# Patient Record
Sex: Male | Born: 1965
Health system: Southern US, Community
[De-identification: ages and names within clinical notes are randomized; demographics above are authoritative.]

## PROBLEM LIST (undated history)

## (undated) DIAGNOSIS — M722 Plantar fascial fibromatosis: Secondary | ICD-10-CM

## (undated) DIAGNOSIS — J439 Emphysema, unspecified: Secondary | ICD-10-CM

## (undated) DIAGNOSIS — G4730 Sleep apnea, unspecified: Secondary | ICD-10-CM

## (undated) DIAGNOSIS — J449 Chronic obstructive pulmonary disease, unspecified: Secondary | ICD-10-CM

## (undated) DIAGNOSIS — I10 Essential (primary) hypertension: Secondary | ICD-10-CM

## (undated) DIAGNOSIS — M549 Dorsalgia, unspecified: Secondary | ICD-10-CM

## (undated) DIAGNOSIS — T7840XA Allergy, unspecified, initial encounter: Secondary | ICD-10-CM

## (undated) DIAGNOSIS — M25569 Pain in unspecified knee: Secondary | ICD-10-CM

## (undated) DIAGNOSIS — I00 Rheumatic fever without heart involvement: Secondary | ICD-10-CM

## (undated) DIAGNOSIS — E7800 Pure hypercholesterolemia, unspecified: Secondary | ICD-10-CM

## (undated) DIAGNOSIS — M25519 Pain in unspecified shoulder: Secondary | ICD-10-CM

## (undated) DIAGNOSIS — K5900 Constipation, unspecified: Secondary | ICD-10-CM

## (undated) DIAGNOSIS — F419 Anxiety disorder, unspecified: Secondary | ICD-10-CM

## (undated) DIAGNOSIS — M2550 Pain in unspecified joint: Secondary | ICD-10-CM

## (undated) DIAGNOSIS — M1990 Unspecified osteoarthritis, unspecified site: Secondary | ICD-10-CM

## (undated) DIAGNOSIS — R600 Localized edema: Secondary | ICD-10-CM

## (undated) DIAGNOSIS — R0602 Shortness of breath: Secondary | ICD-10-CM

## (undated) DIAGNOSIS — K219 Gastro-esophageal reflux disease without esophagitis: Secondary | ICD-10-CM

## (undated) DIAGNOSIS — F32A Depression, unspecified: Secondary | ICD-10-CM

## (undated) HISTORY — PX: ANTERIOR CRUCIATE LIGAMENT REPAIR: SHX115

## (undated) HISTORY — DX: Chronic obstructive pulmonary disease, unspecified: J44.9

## (undated) HISTORY — DX: Constipation, unspecified: K59.00

## (undated) HISTORY — DX: Pure hypercholesterolemia, unspecified: E78.00

## (undated) HISTORY — DX: Pain in unspecified joint: M25.50

## (undated) HISTORY — DX: Pain in unspecified knee: M25.569

## (undated) HISTORY — DX: Anxiety disorder, unspecified: F41.9

## (undated) HISTORY — DX: Unspecified osteoarthritis, unspecified site: M19.90

## (undated) HISTORY — DX: Pain in unspecified shoulder: M25.519

## (undated) HISTORY — DX: Localized edema: R60.0

## (undated) HISTORY — DX: Depression, unspecified: F32.A

## (undated) HISTORY — DX: Rheumatic fever without heart involvement: I00

## (undated) HISTORY — PX: BUNIONECTOMY: SHX129

## (undated) HISTORY — DX: Gastro-esophageal reflux disease without esophagitis: K21.9

## (undated) HISTORY — PX: HUMERUS FRACTURE SURGERY: SHX670

## (undated) HISTORY — DX: Emphysema, unspecified: J43.9

## (undated) HISTORY — DX: Sleep apnea, unspecified: G47.30

## (undated) HISTORY — PX: ROTATOR CUFF REPAIR: SHX139

## (undated) HISTORY — DX: Shortness of breath: R06.02

## (undated) HISTORY — DX: Plantar fascial fibromatosis: M72.2

## (undated) HISTORY — PX: FRACTURE SURGERY: SHX138

## (undated) HISTORY — DX: Allergy, unspecified, initial encounter: T78.40XA

## (undated) HISTORY — DX: Dorsalgia, unspecified: M54.9

---

## 1991-11-02 HISTORY — PX: KNEE ARTHROSCOPY: SHX127

## 2003-06-01 ENCOUNTER — Ambulatory Visit (HOSPITAL_BASED_OUTPATIENT_CLINIC_OR_DEPARTMENT_OTHER): Admission: RE | Admit: 2003-06-01 | Discharge: 2003-06-01 | Payer: Self-pay | Admitting: Family Medicine

## 2004-12-16 ENCOUNTER — Ambulatory Visit (HOSPITAL_COMMUNITY): Admission: RE | Admit: 2004-12-16 | Discharge: 2004-12-16 | Payer: Self-pay | Admitting: Orthopedic Surgery

## 2005-02-02 ENCOUNTER — Ambulatory Visit (HOSPITAL_BASED_OUTPATIENT_CLINIC_OR_DEPARTMENT_OTHER): Admission: RE | Admit: 2005-02-02 | Discharge: 2005-02-02 | Payer: Self-pay | Admitting: Orthopedic Surgery

## 2005-10-15 ENCOUNTER — Ambulatory Visit (HOSPITAL_COMMUNITY): Admission: RE | Admit: 2005-10-15 | Discharge: 2005-10-15 | Payer: Self-pay | Admitting: Orthopedic Surgery

## 2005-10-22 ENCOUNTER — Emergency Department (HOSPITAL_COMMUNITY): Admission: EM | Admit: 2005-10-22 | Discharge: 2005-10-22 | Payer: Self-pay | Admitting: Emergency Medicine

## 2011-05-28 ENCOUNTER — Emergency Department (HOSPITAL_COMMUNITY): Payer: 59

## 2011-05-28 ENCOUNTER — Emergency Department (HOSPITAL_COMMUNITY)
Admission: EM | Admit: 2011-05-28 | Discharge: 2011-05-29 | Disposition: A | Discharge status: Home / Self Care | Payer: 59 | Attending: Emergency Medicine | Admitting: Emergency Medicine

## 2011-05-28 DIAGNOSIS — Z79899 Other long term (current) drug therapy: Secondary | ICD-10-CM | POA: Insufficient documentation

## 2011-05-28 DIAGNOSIS — R209 Unspecified disturbances of skin sensation: Secondary | ICD-10-CM | POA: Insufficient documentation

## 2011-05-28 DIAGNOSIS — G510 Bell's palsy: Secondary | ICD-10-CM | POA: Insufficient documentation

## 2011-05-28 DIAGNOSIS — R42 Dizziness and giddiness: Secondary | ICD-10-CM | POA: Insufficient documentation

## 2011-05-28 DIAGNOSIS — R51 Headache: Secondary | ICD-10-CM | POA: Insufficient documentation

## 2011-05-28 DIAGNOSIS — R1310 Dysphagia, unspecified: Secondary | ICD-10-CM | POA: Insufficient documentation

## 2011-05-28 DIAGNOSIS — R634 Abnormal weight loss: Secondary | ICD-10-CM | POA: Insufficient documentation

## 2011-05-28 DIAGNOSIS — R112 Nausea with vomiting, unspecified: Secondary | ICD-10-CM | POA: Insufficient documentation

## 2011-05-28 DIAGNOSIS — R29810 Facial weakness: Secondary | ICD-10-CM | POA: Insufficient documentation

## 2011-05-28 DIAGNOSIS — I10 Essential (primary) hypertension: Secondary | ICD-10-CM | POA: Insufficient documentation

## 2011-05-28 DIAGNOSIS — R630 Anorexia: Secondary | ICD-10-CM | POA: Insufficient documentation

## 2011-05-28 LAB — POCT I-STAT, CHEM 8
BUN: 31 mg/dL — ABNORMAL HIGH (ref 6–23)
Calcium, Ion: 1.08 mmol/L — ABNORMAL LOW (ref 1.12–1.32)
Chloride: 107 mEq/L (ref 96–112)
Creatinine, Ser: 1.4 mg/dL — ABNORMAL HIGH (ref 0.50–1.35)
Glucose, Bld: 115 mg/dL — ABNORMAL HIGH (ref 70–99)
Hemoglobin: 12.9 g/dL — ABNORMAL LOW (ref 13.0–17.0)
Potassium: 3.8 mEq/L (ref 3.5–5.1)
Sodium: 140 mEq/L (ref 135–145)
TCO2: 22 mmol/L (ref 0–100)

## 2011-05-28 LAB — DIFFERENTIAL
Basophils Absolute: 0.1 10*3/uL (ref 0.0–0.1)
Basophils Relative: 1 % (ref 0–1)
Eosinophils Absolute: 0 10*3/uL (ref 0.0–0.7)
Eosinophils Relative: 0 % (ref 0–5)
Lymphs Abs: 2.5 10*3/uL (ref 0.7–4.0)
Monocytes Absolute: 0.9 10*3/uL (ref 0.1–1.0)
Monocytes Relative: 9 % (ref 3–12)
Neutro Abs: 6.9 10*3/uL (ref 1.7–7.7)

## 2011-05-28 LAB — CBC
HCT: 36.7 % — ABNORMAL LOW (ref 39.0–52.0)
Hemoglobin: 12.7 g/dL — ABNORMAL LOW (ref 13.0–17.0)
MCH: 30 pg (ref 26.0–34.0)
MCHC: 34.6 g/dL (ref 30.0–36.0)
MCV: 86.8 fL (ref 78.0–100.0)
Platelets: 215 10*3/uL (ref 150–400)
RDW: 13.2 % (ref 11.5–15.5)

## 2012-10-14 ENCOUNTER — Encounter (HOSPITAL_COMMUNITY): Payer: Self-pay | Admitting: *Deleted

## 2012-10-14 ENCOUNTER — Emergency Department (INDEPENDENT_AMBULATORY_CARE_PROVIDER_SITE_OTHER)
Admission: EM | Admit: 2012-10-14 | Discharge: 2012-10-14 | Disposition: A | Discharge status: Home / Self Care | Payer: Worker's Compensation | Source: Home / Self Care | Attending: Family Medicine | Admitting: Family Medicine

## 2012-10-14 ENCOUNTER — Emergency Department (INDEPENDENT_AMBULATORY_CARE_PROVIDER_SITE_OTHER): Payer: Worker's Compensation

## 2012-10-14 DIAGNOSIS — S6710XA Crushing injury of unspecified finger(s), initial encounter: Secondary | ICD-10-CM

## 2012-10-14 HISTORY — DX: Essential (primary) hypertension: I10

## 2012-10-14 MED ORDER — OXYCODONE-ACETAMINOPHEN 10-325 MG PO TABS: 1.0000 | ORAL_TABLET | Freq: Four times a day (QID) | ORAL | Status: DC | PRN

## 2012-10-14 NOTE — ED Notes (Signed)
Pt reports smashing finger in metal pin while at work - right 3rd finger

## 2012-10-14 NOTE — ED Provider Notes (Signed)
History     CSN: [id number]  Arrival date & time 10/14/12  1701   First MD Initiated Contact with Patient 10/14/12 1712      Chief Complaint  Patient presents with  . Finger Injury    (Consider location/radiation/quality/duration/timing/severity/associated sxs/prior treatment) Patient is a 46 y.o. male presenting with hand pain. The history is provided by the patient.  Hand Pain This is a new problem. The current episode started less than 1 hour ago (crush injury from heavy metal hinge at work during [employer] .). The problem has not changed since onset.Associated symptoms comments: Tetanus utd..    Past Medical History  Diagnosis Date  . Hypertension     Past Surgical History  Procedure Date  . Anterior cruciate ligament repair   . Rotator cuff repair     Family History  Problem Relation Age of Onset  . Family history unknown: Yes    History  Substance Use Topics  . Smoking status: Never Smoker   . Smokeless tobacco: Not on file  . Alcohol Use: No      Review of Systems  Constitutional: Negative.   Musculoskeletal: Positive for joint swelling.  Skin: Positive for wound.    Allergies  Review of patient's allergies indicates no known allergies.  Home Medications   Current Outpatient Rx  Name  Route  Sig  Dispense  Refill  . VITAMIN C 100 MG PO TABS   Oral   Take 100 mg by mouth daily.         . ASPIRIN BUFFERED 325 MG PO TABS   Oral   Take 325 mg by mouth daily.         . OXYCODONE-ACETAMINOPHEN 10-325 MG PO TABS   Oral   Take 1 tablet by mouth every 6 (six) hours as needed for pain.   30 tablet   0     BP 136/74  Pulse 82  Temp 98.2 F (36.8 C) (Oral)  Resp 18  SpO2 98%  Physical Exam  Nursing note and vitals reviewed. Constitutional: He is oriented to person, place, and time. He appears well-developed and well-nourished. No distress.  Musculoskeletal: He exhibits tenderness.       Hands: Neurological: He is alert and  oriented to person, place, and time.  Skin: Skin is warm and dry.    ED Course  Procedures (including critical care time)  Labs Reviewed - No data to display No results found.   1. Crush injury to finger       MDM  X-rays reviewed and report per radiologist.         Linna Hoff, MD 10/16/12 [phone]

## 2012-10-30 ENCOUNTER — Ambulatory Visit: Payer: Self-pay

## 2012-10-30 ENCOUNTER — Other Ambulatory Visit: Payer: Self-pay | Admitting: Occupational Medicine

## 2012-10-30 DIAGNOSIS — M79646 Pain in unspecified finger(s): Secondary | ICD-10-CM

## 2013-10-19 ENCOUNTER — Ambulatory Visit: Payer: Self-pay | Admitting: Family Medicine

## 2013-10-19 VITALS — BP 146/82 | HR 62 | Temp 98.3°F | Resp 16 | Ht 67.0 in | Wt 184.2 lb

## 2013-10-19 DIAGNOSIS — Z0289 Encounter for other administrative examinations: Secondary | ICD-10-CM

## 2013-10-19 NOTE — Progress Notes (Signed)
U/A SpGr-1.005 Protein- Neg Blood- Neg Glucose- Neg 

## 2013-10-19 NOTE — Progress Notes (Addendum)
This chart was scribed for [name], Chad Brooks by Joaquin Music, ED Scribe. This patient was seen in room Room/bed 1 and the patient's care was started at 8:48 AM. Subjective:    Patient ID: Chad Brooks, male    DOB: December 22, 1965, 47 y.o.   MRN: [id number] Chief Complaint  Patient presents with  . Employment Physical    DOT   HPI Chad Brooks is a 47 y.o. male with a hx of HTN who presents to the [facility] for DOT/Employment Physical. Pt reports being employed for the [employer]. Pt states he takes an Asprin daily for his health. Pt states he has lost weight recently by monitoring his diet. He reports taking Diovan intermittently and states he has been off this medication for 3 weeks, diet controlled only.  Pt reports feeling healthy and states he has been walking daily. He states he has stopped smoking and states this year marks 4 years of not smoking.   Chad Brooks,Chad Brooks, Chad Brooks  There are no active problems to display for this patient.  Past Medical History  Diagnosis Date  . Hypertension   . Allergy    Past Surgical History  Procedure Laterality Date  . Anterior cruciate ligament repair    . Rotator cuff repair     No Known Allergies  Current outpatient prescriptions:Ascorbic Acid (VITAMIN C) 100 MG tablet, Take 100 mg by mouth daily., Disp: , Rfl: ;  aspirin 325 MG buffered tablet, Take 325 mg by mouth daily., Disp: , Rfl: ;  oxyCODONE-acetaminophen (PERCOCET) 10-325 MG per tablet, Take 1 tablet by mouth every 6 (six) hours as needed for pain., Disp: 30 tablet, Rfl: 0  History   Social History  . Marital Status: Single    Spouse Name: N/A    Number of Children: N/A  . Years of Education: N/A   Social History Main Topics  . Smoking status: Never Smoker   . Smokeless tobacco: None  . Alcohol Use: No  . Drug Use: No  . Sexual Activity: No   Other Topics Concern  . None   Social History Narrative  . None   Review of Systems Per DOT form -  reviewed, no positive responses.     Objective:   Physical Exam  Vitals reviewed. Constitutional: He is oriented to person, place, and time. He appears well-developed and well-nourished.  HENT:  Head: Normocephalic and atraumatic.  Right Ear: External ear normal.  Left Ear: External ear normal.  Mouth/Throat: Oropharynx is clear and moist.  Eyes: Conjunctivae and EOM are normal. Pupils are equal, round, and reactive to light.  Neck: Normal range of motion. Neck supple. No JVD present. Carotid bruit is not present. No thyromegaly present.  Cardiovascular: Normal rate, regular rhythm, normal heart sounds and intact distal pulses.   No murmur heard. Pulmonary/Chest: Effort normal and breath sounds normal. No respiratory distress. He has no wheezes. He has no rales.  Abdominal: Soft. He exhibits no distension. There is no tenderness. Hernia confirmed negative in the right inguinal area and confirmed negative in the left inguinal area.  Musculoskeletal: Normal range of motion. He exhibits no edema and no tenderness.  Lymphadenopathy:    He has no cervical adenopathy.  Neurological: He is alert and oriented to person, place, and time. He has normal reflexes.  Skin: Skin is warm and dry.  Psychiatric: He has a normal mood and affect. His behavior is normal.   Filed Vitals:   10/19/13 0819  BP: 140/80  Pulse: 62  Temp: 98.3 F (36.8 C)  TempSrc: Oral  Resp: 16  Height: 5\' 7"  (1.702 m)  Weight: 184 lb 3.2 oz (83.553 kg)  SpO2: 99%   Assessment & Plan:  Chad Brooks is a 47 y.o. male Health examination of defined subpopulation  DOT physical - 1 year card for hx htn, but diet controlled at present. Last took Diovan few weeks ago.  Cont home monitoring and follow up with PCP. See ppwk.   No orders of the defined types were placed in this encounter.   Patient Instructions  1 year card for history of high blood pressure, although diet controlled only for now. Bring your home blood  pressure meter to compare to readings at doctor's office.

## 2013-10-19 NOTE — Patient Instructions (Signed)
1 year card for history of high blood pressure, although diet controlled only for now. Bring your home blood pressure meter to compare to readings at doctor's office.

## 2016-03-10 ENCOUNTER — Other Ambulatory Visit: Payer: Self-pay | Admitting: Surgery

## 2016-03-10 DIAGNOSIS — R1032 Left lower quadrant pain: Secondary | ICD-10-CM

## 2016-03-15 LAB — HM COLONOSCOPY

## 2016-03-17 ENCOUNTER — Ambulatory Visit
Admission: RE | Admit: 2016-03-17 | Discharge: 2016-03-17 | Disposition: A | Discharge status: Home / Self Care | Payer: 59 | Source: Ambulatory Visit | Attending: Surgery | Admitting: Surgery

## 2016-03-17 DIAGNOSIS — R1032 Left lower quadrant pain: Secondary | ICD-10-CM

## 2016-03-17 MED ORDER — IOPAMIDOL (ISOVUE-300) INJECTION 61%
100.0000 mL | Freq: Once | INTRAVENOUS | Status: AC | PRN
  Administered 2016-03-17: 100 mL via INTRAVENOUS

## 2016-09-06 ENCOUNTER — Other Ambulatory Visit: Payer: Self-pay | Admitting: Sports Medicine

## 2016-09-06 DIAGNOSIS — M25512 Pain in left shoulder: Secondary | ICD-10-CM

## 2016-09-08 ENCOUNTER — Ambulatory Visit
Admission: RE | Admit: 2016-09-08 | Discharge: 2016-09-08 | Disposition: A | Discharge status: Home / Self Care | Payer: 59 | Source: Ambulatory Visit | Attending: Sports Medicine | Admitting: Sports Medicine

## 2016-09-08 ENCOUNTER — Other Ambulatory Visit: Payer: Self-pay

## 2016-09-08 DIAGNOSIS — M25512 Pain in left shoulder: Secondary | ICD-10-CM

## 2016-11-02 DIAGNOSIS — M25612 Stiffness of left shoulder, not elsewhere classified: Secondary | ICD-10-CM | POA: Diagnosis not present

## 2016-11-02 DIAGNOSIS — S42295D Other nondisplaced fracture of upper end of left humerus, subsequent encounter for fracture with routine healing: Secondary | ICD-10-CM | POA: Diagnosis not present

## 2016-11-02 DIAGNOSIS — M25512 Pain in left shoulder: Secondary | ICD-10-CM | POA: Diagnosis not present

## 2016-11-04 DIAGNOSIS — M25612 Stiffness of left shoulder, not elsewhere classified: Secondary | ICD-10-CM | POA: Diagnosis not present

## 2016-11-04 DIAGNOSIS — M25512 Pain in left shoulder: Secondary | ICD-10-CM | POA: Diagnosis not present

## 2016-11-04 DIAGNOSIS — S42295D Other nondisplaced fracture of upper end of left humerus, subsequent encounter for fracture with routine healing: Secondary | ICD-10-CM | POA: Diagnosis not present

## 2016-11-05 DIAGNOSIS — E559 Vitamin D deficiency, unspecified: Secondary | ICD-10-CM | POA: Diagnosis not present

## 2016-11-05 DIAGNOSIS — M25512 Pain in left shoulder: Secondary | ICD-10-CM | POA: Diagnosis not present

## 2016-11-08 DIAGNOSIS — M25512 Pain in left shoulder: Secondary | ICD-10-CM | POA: Diagnosis not present

## 2016-11-08 DIAGNOSIS — S42295D Other nondisplaced fracture of upper end of left humerus, subsequent encounter for fracture with routine healing: Secondary | ICD-10-CM | POA: Diagnosis not present

## 2016-11-08 DIAGNOSIS — M25612 Stiffness of left shoulder, not elsewhere classified: Secondary | ICD-10-CM | POA: Diagnosis not present

## 2016-11-11 DIAGNOSIS — M25512 Pain in left shoulder: Secondary | ICD-10-CM | POA: Diagnosis not present

## 2016-11-11 DIAGNOSIS — S42295D Other nondisplaced fracture of upper end of left humerus, subsequent encounter for fracture with routine healing: Secondary | ICD-10-CM | POA: Diagnosis not present

## 2016-11-11 DIAGNOSIS — M25612 Stiffness of left shoulder, not elsewhere classified: Secondary | ICD-10-CM | POA: Diagnosis not present

## 2016-11-16 DIAGNOSIS — S42295D Other nondisplaced fracture of upper end of left humerus, subsequent encounter for fracture with routine healing: Secondary | ICD-10-CM | POA: Diagnosis not present

## 2016-11-16 DIAGNOSIS — M25512 Pain in left shoulder: Secondary | ICD-10-CM | POA: Diagnosis not present

## 2016-11-16 DIAGNOSIS — M25612 Stiffness of left shoulder, not elsewhere classified: Secondary | ICD-10-CM | POA: Diagnosis not present

## 2016-11-22 DIAGNOSIS — M25612 Stiffness of left shoulder, not elsewhere classified: Secondary | ICD-10-CM | POA: Diagnosis not present

## 2016-11-22 DIAGNOSIS — S42295D Other nondisplaced fracture of upper end of left humerus, subsequent encounter for fracture with routine healing: Secondary | ICD-10-CM | POA: Diagnosis not present

## 2016-11-22 DIAGNOSIS — M25512 Pain in left shoulder: Secondary | ICD-10-CM | POA: Diagnosis not present

## 2016-11-29 DIAGNOSIS — M25512 Pain in left shoulder: Secondary | ICD-10-CM | POA: Diagnosis not present

## 2016-12-09 DIAGNOSIS — S42242A 4-part fracture of surgical neck of left humerus, initial encounter for closed fracture: Secondary | ICD-10-CM | POA: Diagnosis not present

## 2016-12-09 DIAGNOSIS — S42202A Unspecified fracture of upper end of left humerus, initial encounter for closed fracture: Secondary | ICD-10-CM | POA: Diagnosis not present

## 2016-12-09 DIAGNOSIS — G8918 Other acute postprocedural pain: Secondary | ICD-10-CM | POA: Diagnosis not present

## 2016-12-22 DIAGNOSIS — S42202D Unspecified fracture of upper end of left humerus, subsequent encounter for fracture with routine healing: Secondary | ICD-10-CM | POA: Diagnosis not present

## 2016-12-22 DIAGNOSIS — S42202G Unspecified fracture of upper end of left humerus, subsequent encounter for fracture with delayed healing: Secondary | ICD-10-CM | POA: Diagnosis not present

## 2017-01-05 DIAGNOSIS — I10 Essential (primary) hypertension: Secondary | ICD-10-CM | POA: Diagnosis not present

## 2017-01-05 DIAGNOSIS — R5383 Other fatigue: Secondary | ICD-10-CM | POA: Diagnosis not present

## 2017-01-19 DIAGNOSIS — S42202D Unspecified fracture of upper end of left humerus, subsequent encounter for fracture with routine healing: Secondary | ICD-10-CM | POA: Diagnosis not present

## 2017-01-24 DIAGNOSIS — D3132 Benign neoplasm of left choroid: Secondary | ICD-10-CM | POA: Diagnosis not present

## 2017-01-24 DIAGNOSIS — H1851 Endothelial corneal dystrophy: Secondary | ICD-10-CM | POA: Diagnosis not present

## 2017-01-24 DIAGNOSIS — H524 Presbyopia: Secondary | ICD-10-CM | POA: Diagnosis not present

## 2017-01-25 DIAGNOSIS — M25512 Pain in left shoulder: Secondary | ICD-10-CM | POA: Diagnosis not present

## 2017-01-25 DIAGNOSIS — S42292G Other displaced fracture of upper end of left humerus, subsequent encounter for fracture with delayed healing: Secondary | ICD-10-CM | POA: Diagnosis not present

## 2017-01-25 DIAGNOSIS — W19XXXD Unspecified fall, subsequent encounter: Secondary | ICD-10-CM | POA: Diagnosis not present

## 2017-02-03 DIAGNOSIS — W19XXXD Unspecified fall, subsequent encounter: Secondary | ICD-10-CM | POA: Diagnosis not present

## 2017-02-03 DIAGNOSIS — S42292G Other displaced fracture of upper end of left humerus, subsequent encounter for fracture with delayed healing: Secondary | ICD-10-CM | POA: Diagnosis not present

## 2017-02-03 DIAGNOSIS — M25512 Pain in left shoulder: Secondary | ICD-10-CM | POA: Diagnosis not present

## 2017-02-10 DIAGNOSIS — W19XXXD Unspecified fall, subsequent encounter: Secondary | ICD-10-CM | POA: Diagnosis not present

## 2017-02-10 DIAGNOSIS — S42292G Other displaced fracture of upper end of left humerus, subsequent encounter for fracture with delayed healing: Secondary | ICD-10-CM | POA: Diagnosis not present

## 2017-02-10 DIAGNOSIS — M25512 Pain in left shoulder: Secondary | ICD-10-CM | POA: Diagnosis not present

## 2017-02-14 DIAGNOSIS — W19XXXD Unspecified fall, subsequent encounter: Secondary | ICD-10-CM | POA: Diagnosis not present

## 2017-02-14 DIAGNOSIS — M25512 Pain in left shoulder: Secondary | ICD-10-CM | POA: Diagnosis not present

## 2017-02-14 DIAGNOSIS — S42292G Other displaced fracture of upper end of left humerus, subsequent encounter for fracture with delayed healing: Secondary | ICD-10-CM | POA: Diagnosis not present

## 2017-02-16 DIAGNOSIS — H1031 Unspecified acute conjunctivitis, right eye: Secondary | ICD-10-CM | POA: Diagnosis not present

## 2017-02-21 DIAGNOSIS — W19XXXD Unspecified fall, subsequent encounter: Secondary | ICD-10-CM | POA: Diagnosis not present

## 2017-02-21 DIAGNOSIS — M25512 Pain in left shoulder: Secondary | ICD-10-CM | POA: Diagnosis not present

## 2017-02-21 DIAGNOSIS — S42292G Other displaced fracture of upper end of left humerus, subsequent encounter for fracture with delayed healing: Secondary | ICD-10-CM | POA: Diagnosis not present

## 2017-02-23 DIAGNOSIS — M25512 Pain in left shoulder: Secondary | ICD-10-CM | POA: Diagnosis not present

## 2017-04-06 DIAGNOSIS — M25512 Pain in left shoulder: Secondary | ICD-10-CM | POA: Diagnosis not present

## 2017-05-24 ENCOUNTER — Ambulatory Visit
Admission: RE | Admit: 2017-05-24 | Discharge: 2017-05-24 | Disposition: A | Discharge status: Home / Self Care | Payer: 59 | Source: Ambulatory Visit | Attending: Family Medicine | Admitting: Family Medicine

## 2017-05-24 ENCOUNTER — Other Ambulatory Visit: Payer: Self-pay | Admitting: Family Medicine

## 2017-05-24 DIAGNOSIS — H6123 Impacted cerumen, bilateral: Secondary | ICD-10-CM | POA: Diagnosis not present

## 2017-05-24 DIAGNOSIS — M25511 Pain in right shoulder: Secondary | ICD-10-CM | POA: Diagnosis not present

## 2017-05-24 DIAGNOSIS — M79644 Pain in right finger(s): Secondary | ICD-10-CM | POA: Diagnosis not present

## 2017-05-24 DIAGNOSIS — M19041 Primary osteoarthritis, right hand: Secondary | ICD-10-CM | POA: Diagnosis not present

## 2017-06-13 DIAGNOSIS — M25511 Pain in right shoulder: Secondary | ICD-10-CM | POA: Diagnosis not present

## 2017-06-29 DIAGNOSIS — M25511 Pain in right shoulder: Secondary | ICD-10-CM | POA: Diagnosis not present

## 2017-07-06 DIAGNOSIS — M25511 Pain in right shoulder: Secondary | ICD-10-CM | POA: Diagnosis not present

## 2017-07-19 DIAGNOSIS — S70361A Insect bite (nonvenomous), right thigh, initial encounter: Secondary | ICD-10-CM | POA: Diagnosis not present

## 2017-07-19 DIAGNOSIS — S70362A Insect bite (nonvenomous), left thigh, initial encounter: Secondary | ICD-10-CM | POA: Diagnosis not present

## 2017-07-21 DIAGNOSIS — G8918 Other acute postprocedural pain: Secondary | ICD-10-CM | POA: Diagnosis not present

## 2017-07-21 DIAGNOSIS — M7541 Impingement syndrome of right shoulder: Secondary | ICD-10-CM | POA: Diagnosis not present

## 2017-07-21 DIAGNOSIS — M24111 Other articular cartilage disorders, right shoulder: Secondary | ICD-10-CM | POA: Diagnosis not present

## 2017-07-21 DIAGNOSIS — M7581 Other shoulder lesions, right shoulder: Secondary | ICD-10-CM | POA: Diagnosis not present

## 2017-07-21 DIAGNOSIS — M7551 Bursitis of right shoulder: Secondary | ICD-10-CM | POA: Diagnosis not present

## 2017-07-21 DIAGNOSIS — S46011A Strain of muscle(s) and tendon(s) of the rotator cuff of right shoulder, initial encounter: Secondary | ICD-10-CM | POA: Diagnosis not present

## 2017-07-25 DIAGNOSIS — M25511 Pain in right shoulder: Secondary | ICD-10-CM | POA: Diagnosis not present

## 2017-07-25 DIAGNOSIS — M75121 Complete rotator cuff tear or rupture of right shoulder, not specified as traumatic: Secondary | ICD-10-CM | POA: Diagnosis not present

## 2017-07-25 DIAGNOSIS — R531 Weakness: Secondary | ICD-10-CM | POA: Diagnosis not present

## 2017-07-28 DIAGNOSIS — R531 Weakness: Secondary | ICD-10-CM | POA: Diagnosis not present

## 2017-07-28 DIAGNOSIS — M25551 Pain in right hip: Secondary | ICD-10-CM | POA: Diagnosis not present

## 2017-07-28 DIAGNOSIS — M75121 Complete rotator cuff tear or rupture of right shoulder, not specified as traumatic: Secondary | ICD-10-CM | POA: Diagnosis not present

## 2017-08-02 DIAGNOSIS — R531 Weakness: Secondary | ICD-10-CM | POA: Diagnosis not present

## 2017-08-02 DIAGNOSIS — M25511 Pain in right shoulder: Secondary | ICD-10-CM | POA: Diagnosis not present

## 2017-08-02 DIAGNOSIS — M75121 Complete rotator cuff tear or rupture of right shoulder, not specified as traumatic: Secondary | ICD-10-CM | POA: Diagnosis not present

## 2017-08-04 DIAGNOSIS — M25511 Pain in right shoulder: Secondary | ICD-10-CM | POA: Diagnosis not present

## 2017-08-04 DIAGNOSIS — R531 Weakness: Secondary | ICD-10-CM | POA: Diagnosis not present

## 2017-08-04 DIAGNOSIS — M75121 Complete rotator cuff tear or rupture of right shoulder, not specified as traumatic: Secondary | ICD-10-CM | POA: Diagnosis not present

## 2017-08-09 DIAGNOSIS — M75121 Complete rotator cuff tear or rupture of right shoulder, not specified as traumatic: Secondary | ICD-10-CM | POA: Diagnosis not present

## 2017-08-09 DIAGNOSIS — R531 Weakness: Secondary | ICD-10-CM | POA: Diagnosis not present

## 2017-08-09 DIAGNOSIS — M25511 Pain in right shoulder: Secondary | ICD-10-CM | POA: Diagnosis not present

## 2017-08-11 DIAGNOSIS — M75121 Complete rotator cuff tear or rupture of right shoulder, not specified as traumatic: Secondary | ICD-10-CM | POA: Diagnosis not present

## 2017-08-11 DIAGNOSIS — R531 Weakness: Secondary | ICD-10-CM | POA: Diagnosis not present

## 2017-08-11 DIAGNOSIS — M25511 Pain in right shoulder: Secondary | ICD-10-CM | POA: Diagnosis not present

## 2017-08-16 DIAGNOSIS — M75121 Complete rotator cuff tear or rupture of right shoulder, not specified as traumatic: Secondary | ICD-10-CM | POA: Diagnosis not present

## 2017-08-16 DIAGNOSIS — M25511 Pain in right shoulder: Secondary | ICD-10-CM | POA: Diagnosis not present

## 2017-08-16 DIAGNOSIS — R531 Weakness: Secondary | ICD-10-CM | POA: Diagnosis not present

## 2017-08-17 DIAGNOSIS — M25511 Pain in right shoulder: Secondary | ICD-10-CM | POA: Diagnosis not present

## 2017-08-17 DIAGNOSIS — Z125 Encounter for screening for malignant neoplasm of prostate: Secondary | ICD-10-CM | POA: Diagnosis not present

## 2017-08-17 DIAGNOSIS — I10 Essential (primary) hypertension: Secondary | ICD-10-CM | POA: Diagnosis not present

## 2017-08-17 DIAGNOSIS — Z23 Encounter for immunization: Secondary | ICD-10-CM | POA: Diagnosis not present

## 2017-08-17 DIAGNOSIS — Z0000 Encounter for general adult medical examination without abnormal findings: Secondary | ICD-10-CM | POA: Diagnosis not present

## 2017-08-17 DIAGNOSIS — M79644 Pain in right finger(s): Secondary | ICD-10-CM | POA: Diagnosis not present

## 2017-08-18 ENCOUNTER — Other Ambulatory Visit: Payer: Self-pay | Admitting: Family Medicine

## 2017-08-18 DIAGNOSIS — M25511 Pain in right shoulder: Secondary | ICD-10-CM | POA: Diagnosis not present

## 2017-08-18 DIAGNOSIS — R0989 Other specified symptoms and signs involving the circulatory and respiratory systems: Secondary | ICD-10-CM

## 2017-08-18 DIAGNOSIS — M75121 Complete rotator cuff tear or rupture of right shoulder, not specified as traumatic: Secondary | ICD-10-CM | POA: Diagnosis not present

## 2017-08-18 DIAGNOSIS — R531 Weakness: Secondary | ICD-10-CM | POA: Diagnosis not present

## 2017-08-23 ENCOUNTER — Other Ambulatory Visit: Payer: Self-pay

## 2017-08-23 ENCOUNTER — Ambulatory Visit
Admission: RE | Admit: 2017-08-23 | Discharge: 2017-08-23 | Disposition: A | Discharge status: Home / Self Care | Payer: 59 | Source: Ambulatory Visit | Attending: Family Medicine | Admitting: Family Medicine

## 2017-08-23 DIAGNOSIS — M25511 Pain in right shoulder: Secondary | ICD-10-CM | POA: Diagnosis not present

## 2017-08-23 DIAGNOSIS — M75121 Complete rotator cuff tear or rupture of right shoulder, not specified as traumatic: Secondary | ICD-10-CM | POA: Diagnosis not present

## 2017-08-23 DIAGNOSIS — R531 Weakness: Secondary | ICD-10-CM | POA: Diagnosis not present

## 2017-08-23 DIAGNOSIS — M7989 Other specified soft tissue disorders: Secondary | ICD-10-CM | POA: Diagnosis not present

## 2017-08-23 DIAGNOSIS — R0989 Other specified symptoms and signs involving the circulatory and respiratory systems: Secondary | ICD-10-CM

## 2017-08-25 DIAGNOSIS — R531 Weakness: Secondary | ICD-10-CM | POA: Diagnosis not present

## 2017-08-25 DIAGNOSIS — M25511 Pain in right shoulder: Secondary | ICD-10-CM | POA: Diagnosis not present

## 2017-08-25 DIAGNOSIS — M75121 Complete rotator cuff tear or rupture of right shoulder, not specified as traumatic: Secondary | ICD-10-CM | POA: Diagnosis not present

## 2017-08-30 DIAGNOSIS — M75121 Complete rotator cuff tear or rupture of right shoulder, not specified as traumatic: Secondary | ICD-10-CM | POA: Diagnosis not present

## 2017-08-30 DIAGNOSIS — M25511 Pain in right shoulder: Secondary | ICD-10-CM | POA: Diagnosis not present

## 2017-08-30 DIAGNOSIS — R531 Weakness: Secondary | ICD-10-CM | POA: Diagnosis not present

## 2017-09-01 DIAGNOSIS — M75121 Complete rotator cuff tear or rupture of right shoulder, not specified as traumatic: Secondary | ICD-10-CM | POA: Diagnosis not present

## 2017-09-01 DIAGNOSIS — E871 Hypo-osmolality and hyponatremia: Secondary | ICD-10-CM | POA: Diagnosis not present

## 2017-09-01 DIAGNOSIS — R531 Weakness: Secondary | ICD-10-CM | POA: Diagnosis not present

## 2017-09-01 DIAGNOSIS — M25511 Pain in right shoulder: Secondary | ICD-10-CM | POA: Diagnosis not present

## 2017-09-06 DIAGNOSIS — R531 Weakness: Secondary | ICD-10-CM | POA: Diagnosis not present

## 2017-09-06 DIAGNOSIS — M25511 Pain in right shoulder: Secondary | ICD-10-CM | POA: Diagnosis not present

## 2017-09-06 DIAGNOSIS — M75121 Complete rotator cuff tear or rupture of right shoulder, not specified as traumatic: Secondary | ICD-10-CM | POA: Diagnosis not present

## 2017-09-08 DIAGNOSIS — R531 Weakness: Secondary | ICD-10-CM | POA: Diagnosis not present

## 2017-09-08 DIAGNOSIS — M75121 Complete rotator cuff tear or rupture of right shoulder, not specified as traumatic: Secondary | ICD-10-CM | POA: Diagnosis not present

## 2017-09-08 DIAGNOSIS — M25511 Pain in right shoulder: Secondary | ICD-10-CM | POA: Diagnosis not present

## 2017-09-13 DIAGNOSIS — R531 Weakness: Secondary | ICD-10-CM | POA: Diagnosis not present

## 2017-09-13 DIAGNOSIS — M25511 Pain in right shoulder: Secondary | ICD-10-CM | POA: Diagnosis not present

## 2017-09-13 DIAGNOSIS — M75121 Complete rotator cuff tear or rupture of right shoulder, not specified as traumatic: Secondary | ICD-10-CM | POA: Diagnosis not present

## 2017-09-15 DIAGNOSIS — M25511 Pain in right shoulder: Secondary | ICD-10-CM | POA: Diagnosis not present

## 2017-09-15 DIAGNOSIS — R531 Weakness: Secondary | ICD-10-CM | POA: Diagnosis not present

## 2017-09-15 DIAGNOSIS — M75121 Complete rotator cuff tear or rupture of right shoulder, not specified as traumatic: Secondary | ICD-10-CM | POA: Diagnosis not present

## 2017-09-19 DIAGNOSIS — M25511 Pain in right shoulder: Secondary | ICD-10-CM | POA: Diagnosis not present

## 2017-09-19 DIAGNOSIS — R531 Weakness: Secondary | ICD-10-CM | POA: Diagnosis not present

## 2017-09-19 DIAGNOSIS — M75121 Complete rotator cuff tear or rupture of right shoulder, not specified as traumatic: Secondary | ICD-10-CM | POA: Diagnosis not present

## 2017-09-27 DIAGNOSIS — M25511 Pain in right shoulder: Secondary | ICD-10-CM | POA: Diagnosis not present

## 2017-09-27 DIAGNOSIS — R531 Weakness: Secondary | ICD-10-CM | POA: Diagnosis not present

## 2017-09-27 DIAGNOSIS — M75121 Complete rotator cuff tear or rupture of right shoulder, not specified as traumatic: Secondary | ICD-10-CM | POA: Diagnosis not present

## 2017-09-29 DIAGNOSIS — M75121 Complete rotator cuff tear or rupture of right shoulder, not specified as traumatic: Secondary | ICD-10-CM | POA: Diagnosis not present

## 2017-09-29 DIAGNOSIS — M25511 Pain in right shoulder: Secondary | ICD-10-CM | POA: Diagnosis not present

## 2017-09-29 DIAGNOSIS — H0012 Chalazion right lower eyelid: Secondary | ICD-10-CM | POA: Diagnosis not present

## 2017-09-29 DIAGNOSIS — R531 Weakness: Secondary | ICD-10-CM | POA: Diagnosis not present

## 2017-10-04 DIAGNOSIS — M75121 Complete rotator cuff tear or rupture of right shoulder, not specified as traumatic: Secondary | ICD-10-CM | POA: Diagnosis not present

## 2017-10-04 DIAGNOSIS — M25511 Pain in right shoulder: Secondary | ICD-10-CM | POA: Diagnosis not present

## 2017-10-04 DIAGNOSIS — R531 Weakness: Secondary | ICD-10-CM | POA: Diagnosis not present

## 2017-10-06 DIAGNOSIS — M25511 Pain in right shoulder: Secondary | ICD-10-CM | POA: Diagnosis not present

## 2017-10-06 DIAGNOSIS — M75121 Complete rotator cuff tear or rupture of right shoulder, not specified as traumatic: Secondary | ICD-10-CM | POA: Diagnosis not present

## 2017-10-06 DIAGNOSIS — R531 Weakness: Secondary | ICD-10-CM | POA: Diagnosis not present

## 2017-10-11 DIAGNOSIS — M75121 Complete rotator cuff tear or rupture of right shoulder, not specified as traumatic: Secondary | ICD-10-CM | POA: Diagnosis not present

## 2017-10-11 DIAGNOSIS — M25511 Pain in right shoulder: Secondary | ICD-10-CM | POA: Diagnosis not present

## 2017-10-11 DIAGNOSIS — R531 Weakness: Secondary | ICD-10-CM | POA: Diagnosis not present

## 2017-10-13 DIAGNOSIS — R531 Weakness: Secondary | ICD-10-CM | POA: Diagnosis not present

## 2017-10-13 DIAGNOSIS — M75121 Complete rotator cuff tear or rupture of right shoulder, not specified as traumatic: Secondary | ICD-10-CM | POA: Diagnosis not present

## 2017-10-13 DIAGNOSIS — M25511 Pain in right shoulder: Secondary | ICD-10-CM | POA: Diagnosis not present

## 2017-10-18 DIAGNOSIS — M25511 Pain in right shoulder: Secondary | ICD-10-CM | POA: Diagnosis not present

## 2017-10-18 DIAGNOSIS — M75121 Complete rotator cuff tear or rupture of right shoulder, not specified as traumatic: Secondary | ICD-10-CM | POA: Diagnosis not present

## 2017-10-18 DIAGNOSIS — R531 Weakness: Secondary | ICD-10-CM | POA: Diagnosis not present

## 2017-10-19 DIAGNOSIS — E871 Hypo-osmolality and hyponatremia: Secondary | ICD-10-CM | POA: Diagnosis not present

## 2017-10-19 DIAGNOSIS — M25511 Pain in right shoulder: Secondary | ICD-10-CM | POA: Diagnosis not present

## 2017-10-20 DIAGNOSIS — M75121 Complete rotator cuff tear or rupture of right shoulder, not specified as traumatic: Secondary | ICD-10-CM | POA: Diagnosis not present

## 2017-10-20 DIAGNOSIS — M25511 Pain in right shoulder: Secondary | ICD-10-CM | POA: Diagnosis not present

## 2017-10-20 DIAGNOSIS — R531 Weakness: Secondary | ICD-10-CM | POA: Diagnosis not present

## 2017-10-24 DIAGNOSIS — R531 Weakness: Secondary | ICD-10-CM | POA: Diagnosis not present

## 2017-10-24 DIAGNOSIS — M75121 Complete rotator cuff tear or rupture of right shoulder, not specified as traumatic: Secondary | ICD-10-CM | POA: Diagnosis not present

## 2017-10-24 DIAGNOSIS — M25511 Pain in right shoulder: Secondary | ICD-10-CM | POA: Diagnosis not present

## 2017-10-27 DIAGNOSIS — M25511 Pain in right shoulder: Secondary | ICD-10-CM | POA: Diagnosis not present

## 2017-10-27 DIAGNOSIS — R531 Weakness: Secondary | ICD-10-CM | POA: Diagnosis not present

## 2017-10-27 DIAGNOSIS — M75121 Complete rotator cuff tear or rupture of right shoulder, not specified as traumatic: Secondary | ICD-10-CM | POA: Diagnosis not present

## 2017-10-31 DIAGNOSIS — R531 Weakness: Secondary | ICD-10-CM | POA: Diagnosis not present

## 2017-10-31 DIAGNOSIS — M75121 Complete rotator cuff tear or rupture of right shoulder, not specified as traumatic: Secondary | ICD-10-CM | POA: Diagnosis not present

## 2017-10-31 DIAGNOSIS — M25511 Pain in right shoulder: Secondary | ICD-10-CM | POA: Diagnosis not present

## 2017-11-30 DIAGNOSIS — M25511 Pain in right shoulder: Secondary | ICD-10-CM | POA: Diagnosis not present

## 2017-12-05 DIAGNOSIS — R2232 Localized swelling, mass and lump, left upper limb: Secondary | ICD-10-CM | POA: Diagnosis not present

## 2017-12-05 DIAGNOSIS — M79642 Pain in left hand: Secondary | ICD-10-CM | POA: Diagnosis not present

## 2017-12-26 DIAGNOSIS — R2232 Localized swelling, mass and lump, left upper limb: Secondary | ICD-10-CM | POA: Diagnosis not present

## 2017-12-26 DIAGNOSIS — R2230 Localized swelling, mass and lump, unspecified upper limb: Secondary | ICD-10-CM | POA: Insufficient documentation

## 2017-12-26 DIAGNOSIS — M79642 Pain in left hand: Secondary | ICD-10-CM | POA: Diagnosis not present

## 2017-12-26 DIAGNOSIS — D481 Neoplasm of uncertain behavior of connective and other soft tissue: Secondary | ICD-10-CM | POA: Diagnosis not present

## 2018-01-06 DIAGNOSIS — Z4889 Encounter for other specified surgical aftercare: Secondary | ICD-10-CM | POA: Insufficient documentation

## 2018-01-06 DIAGNOSIS — M1811 Unilateral primary osteoarthritis of first carpometacarpal joint, right hand: Secondary | ICD-10-CM | POA: Diagnosis not present

## 2018-01-13 DIAGNOSIS — H0100A Unspecified blepharitis right eye, upper and lower eyelids: Secondary | ICD-10-CM | POA: Diagnosis not present

## 2018-01-13 DIAGNOSIS — H0100B Unspecified blepharitis left eye, upper and lower eyelids: Secondary | ICD-10-CM | POA: Diagnosis not present

## 2018-01-13 DIAGNOSIS — H1031 Unspecified acute conjunctivitis, right eye: Secondary | ICD-10-CM | POA: Diagnosis not present

## 2018-01-25 DIAGNOSIS — H1851 Endothelial corneal dystrophy: Secondary | ICD-10-CM | POA: Diagnosis not present

## 2018-01-25 DIAGNOSIS — D3132 Benign neoplasm of left choroid: Secondary | ICD-10-CM | POA: Diagnosis not present

## 2018-08-29 DIAGNOSIS — Z125 Encounter for screening for malignant neoplasm of prostate: Secondary | ICD-10-CM | POA: Diagnosis not present

## 2018-08-29 DIAGNOSIS — Z23 Encounter for immunization: Secondary | ICD-10-CM | POA: Diagnosis not present

## 2018-08-29 DIAGNOSIS — I10 Essential (primary) hypertension: Secondary | ICD-10-CM | POA: Diagnosis not present

## 2018-08-29 DIAGNOSIS — M79644 Pain in right finger(s): Secondary | ICD-10-CM | POA: Diagnosis not present

## 2018-08-29 DIAGNOSIS — N3281 Overactive bladder: Secondary | ICD-10-CM | POA: Diagnosis not present

## 2018-08-29 DIAGNOSIS — Z0000 Encounter for general adult medical examination without abnormal findings: Secondary | ICD-10-CM | POA: Diagnosis not present

## 2018-09-20 DIAGNOSIS — E871 Hypo-osmolality and hyponatremia: Secondary | ICD-10-CM | POA: Diagnosis not present

## 2018-11-06 DIAGNOSIS — E7800 Pure hypercholesterolemia, unspecified: Secondary | ICD-10-CM | POA: Diagnosis not present

## 2019-03-05 DIAGNOSIS — E7800 Pure hypercholesterolemia, unspecified: Secondary | ICD-10-CM | POA: Diagnosis not present

## 2019-03-05 DIAGNOSIS — I10 Essential (primary) hypertension: Secondary | ICD-10-CM | POA: Diagnosis not present

## 2019-03-05 DIAGNOSIS — M79644 Pain in right finger(s): Secondary | ICD-10-CM | POA: Diagnosis not present

## 2019-06-18 IMAGING — CR DG FINGER THUMB 2+V*R*
2 series · 2 of 2 positions shown · non-contrast
Comparison: None in PACs

CLINICAL DATA: Pain at the base of the right thumb.

EXAM:
RIGHT THUMB 2+V

[x finger pa right]
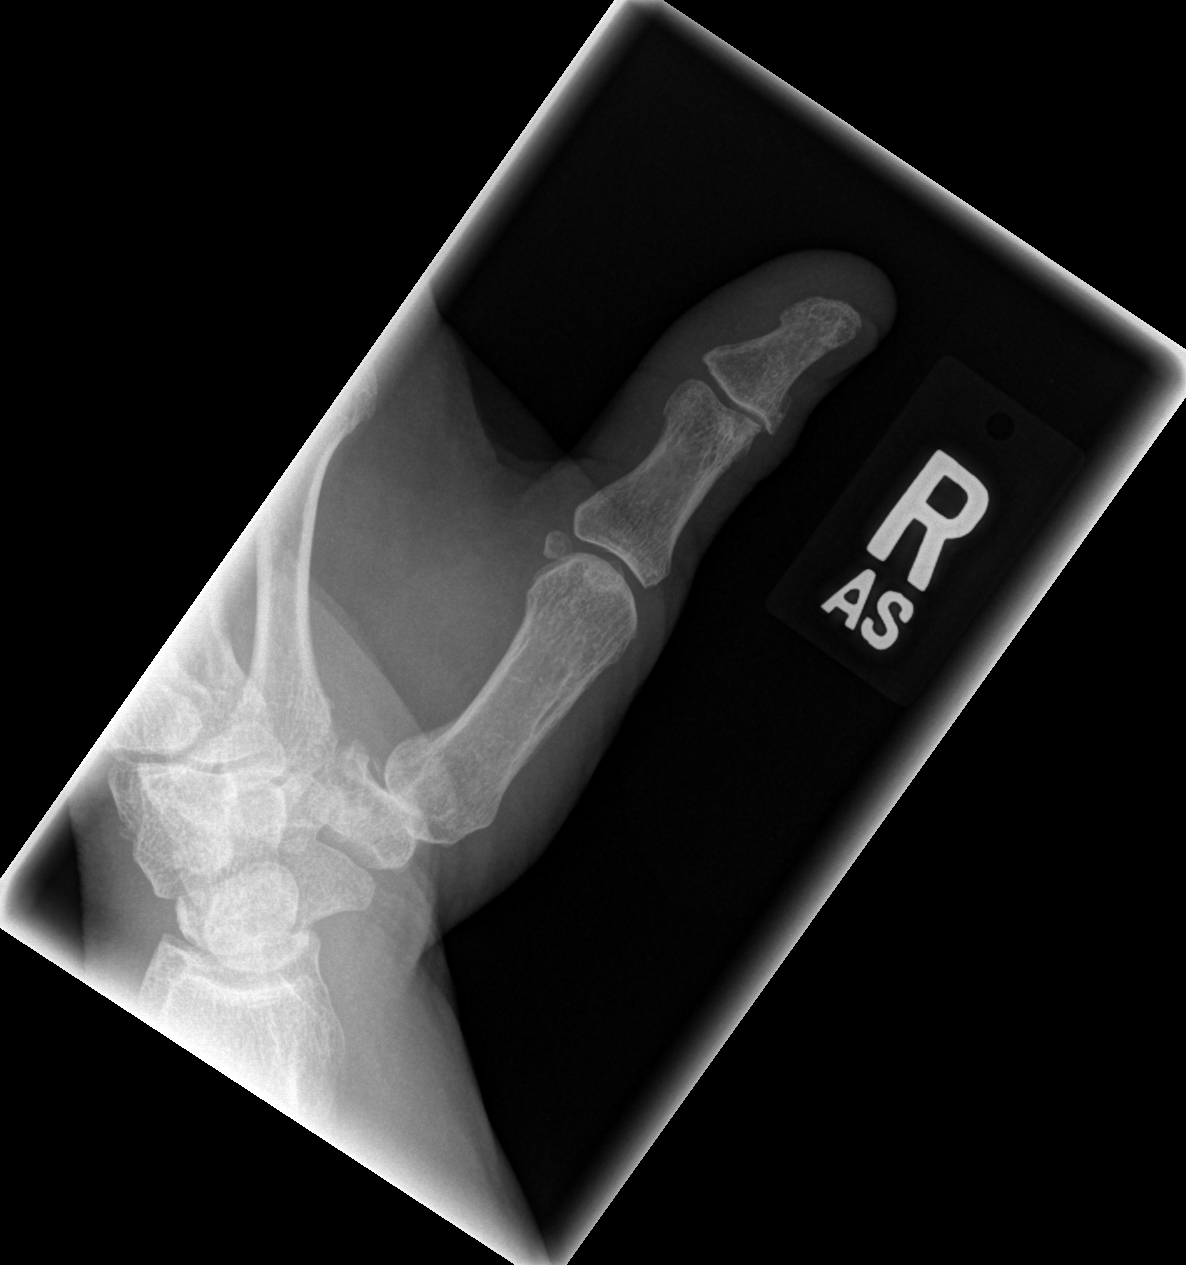

[x finger lateral right]
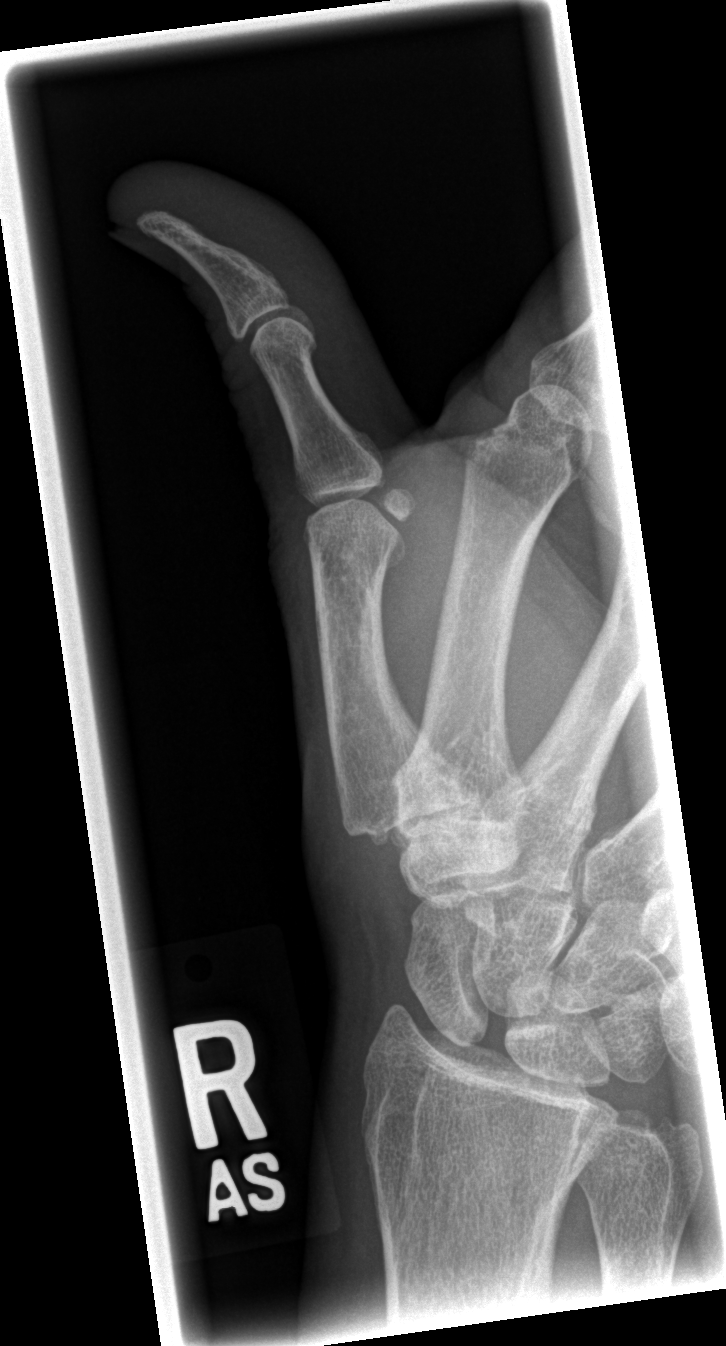

[2 of 2 positions shown; findings below may reference images not displayed]

FINDINGS: Bones of the thumb are subjectively adequately mineralized. The
phalanges and metacarpal are intact. The IP and MCP joints are
normal. There is moderate osteoarthritic joint space loss involving
the first CMC joint. No erosive changes are observed.
IMPRESSION: Moderate osteoarthritic changes of the first CMC joint. No acute
abnormality is observed.

## 2019-08-10 ENCOUNTER — Other Ambulatory Visit: Payer: Self-pay

## 2019-08-10 ENCOUNTER — Other Ambulatory Visit: Payer: Self-pay | Admitting: Family Medicine

## 2019-08-10 ENCOUNTER — Ambulatory Visit
Admission: RE | Admit: 2019-08-10 | Discharge: 2019-08-10 | Disposition: A | Discharge status: Home / Self Care | Payer: 59 | Source: Ambulatory Visit | Attending: Family Medicine | Admitting: Family Medicine

## 2019-08-10 DIAGNOSIS — R52 Pain, unspecified: Secondary | ICD-10-CM

## 2019-08-15 ENCOUNTER — Other Ambulatory Visit: Payer: Self-pay

## 2019-08-15 DIAGNOSIS — Z20822 Contact with and (suspected) exposure to covid-19: Secondary | ICD-10-CM

## 2019-08-17 LAB — NOVEL CORONAVIRUS, NAA: SARS-CoV-2, NAA: NOT DETECTED

## 2019-08-28 ENCOUNTER — Other Ambulatory Visit: Payer: Self-pay

## 2019-08-28 DIAGNOSIS — Z20822 Contact with and (suspected) exposure to covid-19: Secondary | ICD-10-CM

## 2019-08-30 LAB — NOVEL CORONAVIRUS, NAA: SARS-CoV-2, NAA: NOT DETECTED

## 2019-11-06 ENCOUNTER — Ambulatory Visit: Payer: 59 | Admitting: Podiatry

## 2019-11-06 ENCOUNTER — Other Ambulatory Visit: Payer: Self-pay

## 2019-11-06 ENCOUNTER — Encounter: Payer: Self-pay | Admitting: Podiatry

## 2019-11-06 ENCOUNTER — Ambulatory Visit (INDEPENDENT_AMBULATORY_CARE_PROVIDER_SITE_OTHER): Payer: 59

## 2019-11-06 VITALS — BP 164/93 | HR 70 | Resp 16

## 2019-11-06 DIAGNOSIS — M778 Other enthesopathies, not elsewhere classified: Secondary | ICD-10-CM | POA: Diagnosis not present

## 2019-11-06 DIAGNOSIS — M2011 Hallux valgus (acquired), right foot: Secondary | ICD-10-CM | POA: Diagnosis not present

## 2019-11-06 DIAGNOSIS — M2041 Other hammer toe(s) (acquired), right foot: Secondary | ICD-10-CM

## 2019-11-07 NOTE — Progress Notes (Signed)
  Subjective:  Patient ID: MD GORY, male    DOB: August 31, 1966,  MRN: [id number] HPI Chief Complaint  Patient presents with  . Foot Pain    2nd toe/MPJ right - aching x couple months, feels swollen, AM pain, tried new shoes and insoles-no help, "bunion don't hurt"  . New Patient (Initial Visit)    54 y.o. male presents with the above complaint.   ROS: Denies fever chills nausea vomiting muscle aches pains calf pain back pain chest pain shortness of breath.  Past Medical History:  Diagnosis Date  . Allergy   . Hypertension    Past Surgical History:  Procedure Laterality Date  . ANTERIOR CRUCIATE LIGAMENT REPAIR    . ROTATOR CUFF REPAIR      Current Outpatient Medications:  .  amLODipine (NORVASC) 5 MG tablet, Take 5 mg by mouth daily., Disp: , Rfl:  .  meloxicam (MOBIC) 15 MG tablet, Take 15 mg by mouth daily., Disp: , Rfl:  .  Multiple Vitamin (ONE-A-DAY MENS PO), Take by mouth., Disp: , Rfl:  .  rosuvastatin (CRESTOR) 10 MG tablet, Take 10 mg by mouth daily., Disp: , Rfl:  .  vitamin E 1000 UNIT capsule, Take 1,000 Units by mouth daily., Disp: , Rfl:  .  telmisartan (MICARDIS) 40 MG tablet, telmisartan 40 mg tablet, Disp: , Rfl:   No Known Allergies Review of Systems Objective:   Vitals:   11/06/19 0949  BP: (!) 164/93  Pulse: 70  Resp: 16    General: Well developed, nourished, in no acute distress, alert and oriented x3   Dermatological: Skin is warm, dry and supple bilateral. Nails x 10 are well maintained; remaining integument appears unremarkable at this time. There are no open sores, no preulcerative lesions, no rash or signs of infection present.  Vascular: Dorsalis Pedis artery and Posterior Tibial artery pedal pulses are 2/4 bilateral with immedate capillary fill time. Pedal hair growth present. No varicosities and no lower extremity edema present bilateral.   Neruologic: Grossly intact via light touch bilateral. Vibratory intact via tuning fork bilateral.  Protective threshold with Semmes Wienstein monofilament intact to all pedal sites bilateral. Patellar and Achilles deep tendon reflexes 2+ bilateral. No Babinski or clonus noted bilateral.   Musculoskeletal: No gross boney pedal deformities bilateral. No pain, crepitus, or limitation noted with foot and ankle range of motion bilateral. Muscular strength 5/5 in all groups tested bilateral.  He has pain and swelling around the second metatarsophalangeal joint with medial deviation of the second toe dorsal dislocation and a diastases between the second and third toes.  Hallux valgus is greater on the right than that on the left.  Toes on the left foot appear to be normal.  Other than the bunion deformity.  Gait: Unassisted, Nonantalgic.    Radiographs:  Radiographs taken today demonstrate hallux abductovalgus deformity with a plantarflexed elongated second metatarsal resulting in hammertoe deformity and medial deviation of the right second toe.    Assessment & Plan:   Assessment: Capsulitis of the second metatarsophalangeal joint bunion deformity.  Plan: Discussed etiology pathology conservative versus surgical therapies.  At this point I injected 10 mg Kenalog 5 mg Marcaine around the second metatarsophalangeal joint of the right foot to help alleviate his symptoms.  He was also scanned for set of orthotics today that should alleviate his symptoms quite nicely.  We did discuss other alternatives such as surgical correction.     Kerri Kovacik T. [place], [place]

## 2019-12-04 ENCOUNTER — Ambulatory Visit: Payer: 59 | Admitting: Orthotics

## 2019-12-04 ENCOUNTER — Other Ambulatory Visit: Payer: Self-pay

## 2019-12-04 DIAGNOSIS — M2041 Other hammer toe(s) (acquired), right foot: Secondary | ICD-10-CM

## 2019-12-04 DIAGNOSIS — M778 Other enthesopathies, not elsewhere classified: Secondary | ICD-10-CM

## 2019-12-04 DIAGNOSIS — M2011 Hallux valgus (acquired), right foot: Secondary | ICD-10-CM

## 2019-12-04 NOTE — Progress Notes (Signed)
Patient came in today to p/up functional foot orthotics.   The orthotics were assessed to both fit and function.  The F/O addressed the biomechanical issues/pathologies as intended, offering good longitudinal arch support, proper offloading, and foot support. There weren't any signs of discomfort or irritation.  The F/O fit properly in footwear with minimal trimming/adjustments. 

## 2019-12-18 ENCOUNTER — Other Ambulatory Visit: Payer: Self-pay

## 2019-12-18 ENCOUNTER — Encounter: Payer: Self-pay | Admitting: Podiatry

## 2019-12-18 ENCOUNTER — Ambulatory Visit: Payer: 59 | Admitting: Podiatry

## 2019-12-18 DIAGNOSIS — M778 Other enthesopathies, not elsewhere classified: Secondary | ICD-10-CM | POA: Diagnosis not present

## 2019-12-18 NOTE — Progress Notes (Signed)
He presents today for follow-up of his capsulitis states that his orthotics are killing him but his foot feels much better.  Objective: Vital signs stable he is alert and oriented x3.  Hallux abductovalgus deformity with hammertoe deformities and pes planus.  He has mild tenderness on palpation of the second metatarsophalangeal joint but not nearly as tender as it was and there is no a lot of swelling to the plantar aspect of the foot.  Assessment: Resolving capsulitis.  Plan: Discussed etiology pathology conservative surgical therapies at this point went ahead and recommend he continue the use of the orthotics and then follow-up with Liliane Channel for adjustments after he has been wearing them for a month or so.

## 2020-04-17 ENCOUNTER — Encounter: Payer: Self-pay | Admitting: Podiatry

## 2020-04-17 ENCOUNTER — Ambulatory Visit: Payer: 59 | Admitting: Podiatry

## 2020-04-17 ENCOUNTER — Other Ambulatory Visit: Payer: Self-pay

## 2020-04-17 DIAGNOSIS — M2011 Hallux valgus (acquired), right foot: Secondary | ICD-10-CM

## 2020-04-17 DIAGNOSIS — M2041 Other hammer toe(s) (acquired), right foot: Secondary | ICD-10-CM | POA: Diagnosis not present

## 2020-04-17 NOTE — Patient Instructions (Signed)
Pre-Operative Instructions  Congratulations, you have decided to take an important step towards improving your quality of life.  You can be assured that the doctors and staff at [facility] will be with you every step of the way.  Here are some important things you should know:  1. Plan to be at the surgery center/hospital at least 1 (one) hour prior to your scheduled time, unless otherwise directed by the surgical center/hospital staff.  You must have a responsible adult accompany you, remain during the surgery and drive you home.  Make sure you have directions to the surgical center/hospital to ensure you arrive on time. 2. If you are having surgery at [facility] or [facility], you will need a copy of your medical history and physical form from your family physician within one month prior to the date of surgery. We will give you a form for your primary physician to complete.  3. We make every effort to accommodate the date you request for surgery.  However, there are times where surgery dates or times have to be moved.  We will contact you as soon as possible if a change in schedule is required.   4. No aspirin/ibuprofen for one week before surgery.  If you are on aspirin, any non-steroidal anti-inflammatory medications (Mobic, Aleve, Ibuprofen) should not be taken seven (7) days prior to your surgery.  You make take Tylenol for pain prior to surgery.  5. Medications - If you are taking daily heart and blood pressure medications, seizure, reflux, allergy, asthma, anxiety, pain or diabetes medications, make sure you notify the surgery center/hospital before the day of surgery so they can tell you which medications you should take or avoid the day of surgery. 6. No food or drink after midnight the night before surgery unless directed otherwise by surgical center/hospital staff. 7. No alcoholic beverages 24-hours prior to surgery.  No smoking 24-hours prior or 24-hours after  surgery. 8. Wear loose pants or shorts. They should be loose enough to fit over bandages, boots, and casts. 9. Don't wear slip-on shoes. Sneakers are preferred. 10. Bring your boot with you to the surgery center/hospital.  Also bring crutches or a walker if your physician has prescribed it for you.  If you do not have this equipment, it will be provided for you after surgery. 11. If you have not been contacted by the surgery center/hospital by the day before your surgery, call to confirm the date and time of your surgery. 12. Leave-time from work may vary depending on the type of surgery you have.  Appropriate arrangements should be made prior to surgery with your employer. 13. Prescriptions will be provided immediately following surgery by your doctor.  Fill these as soon as possible after surgery and take the medication as directed. Pain medications will not be refilled on weekends and must be approved by the doctor. 14. Remove nail polish on the operative foot and avoid getting pedicures prior to surgery. 15. Wash the night before surgery.  The night before surgery wash the foot and leg well with water and the antibacterial soap provided. Be sure to pay special attention to beneath the toenails and in between the toes.  Wash for at least three (3) minutes. Rinse thoroughly with water and dry well with a towel.  Perform this wash unless told not to do so by your physician.  Enclosed: 1 Ice pack (please put in freezer the night before surgery)   1 Hibiclens skin cleaner     Pre-op instructions  If you have any questions regarding the instructions, please do not hesitate to call our office.  [place]: [address], , [place] [address] -- [phone]  [place]: [address] [place], [place] [address] -- [phone]  [place]: [address], [place], [place] [address] -- [phone]   Website: [email or web address] 

## 2020-04-19 NOTE — Progress Notes (Signed)
He presents today to discuss surgery on the right foot.  He states this seems to be getting worse and feels like him walking on a rock all the time as he refers to the right foot.  He states that is starting to affect my ability to perform my job and my daily activities and is reducing my ability to stay active.  Objective: I have reviewed his past medical history medications allergies surgery social history and review of systems.  Vital signs are stable he is alert and oriented x3 pulses are palpable.  He has hallux abductovalgus deformity of the right foot that is resulting in an elevation of the second toe and pain at the level of the second metatarsal phalangeal joint.  He also has a contracted hammertoe deformity.  Radiographs were reviewed today demonstrating hallux valgus and elongated second metatarsal and a contracted second digit.  Otherwise no acute findings are noted.  Assessment: Hallux valgus plantarflexed second metatarsal hammertoe deformity all on the right foot.  Plan: At this point surgery is indicated conservative therapies have failed to alleviate his symptoms.  So at this time I feel that we have to perform an Liane Comber type osteotomy to realign the first metatarsophalangeal joint second metatarsal osteotomy to decompress the second metatarsophalangeal joint and hammertoe repair to bring the toe down in front of the metatarsal.  He understands that this will be resultant pinning of the toe he understands and is amenable to it we did discuss the possible postop complications and the time needed off for this.  We discussed possible postop complications which may include but not limited to postop pain bleeding swelling infection recurrence need for further surgery overcorrection under correction loss of digit loss of limb loss of life.  He was provided with both oral and written home-going information as well as a cam walker for surgery.  We gave him information regarding the surgery  center instructions for the morning of surgery and information regarding anesthesia group.  I will follow-up with him in the near future for surgical intervention.

## 2020-07-17 ENCOUNTER — Telehealth: Payer: Self-pay

## 2020-07-17 ENCOUNTER — Other Ambulatory Visit: Payer: Self-pay | Admitting: Podiatry

## 2020-07-17 MED ORDER — OXYCODONE-ACETAMINOPHEN 10-325 MG PO TABS: 1.0000 | ORAL_TABLET | Freq: Three times a day (TID) | ORAL | 0 refills | Status: DC | PRN

## 2020-07-17 MED ORDER — CEPHALEXIN 500 MG PO CAPS: 500.0000 mg | ORAL_CAPSULE | Freq: Three times a day (TID) | ORAL | 0 refills | Status: DC

## 2020-07-17 MED ORDER — ONDANSETRON HCL 4 MG PO TABS: 4.0000 mg | ORAL_TABLET | Freq: Three times a day (TID) | ORAL | 0 refills | Status: DC | PRN

## 2020-07-17 NOTE — Telephone Encounter (Signed)
DOS 07/18/2020  [place] BUNIONECTOMY RT - 28296 METATARSAL OSTEOTOMY 2ND RT - 28308 HAMMERTOE REPAIR 2ND RT - 28285  [facility] EFFECTIVE DATE - 11/02/2019  PLAN DEDUCTIBLE - $500.00 W/ $0.00 REMAINING OUT OF POCKET - $2500.00 W/ $3491.79 REMAINING COPAY $0.00 COINSURANCE - 20%   NOTIFICATION/PRIOR AUTHORIZATION NUMBER CASE STATUS CASE STATUS REASON PRIMARY CARE PHYSICIAN [id number] Closed Case Was Managed And Is Now Complete - ADVANCE NOTIFY DATE/TIME ADMISSION NOTIFY DATE/TIME 07/15/2020 09:25 AM CDT - COVERAGE STATUS OVERALL COVERAGE STATUS Covered/Approved 1-3 CODE DESCRIPTION COVERAGE STATUS DECISION DATE FAC [place] Coverage determination is reflected for the facility admission and is not a guarantee of payment for ongoing services. Covered/Approved 07/15/2020 1 28285 Correction, hammertoe (eg, interphalange more Covered/Approved 07/15/2020 2 28308 Osteotomy, with or without lengthening, more Covered/Approved 07/15/2020 3 28296 Correction, hallux valgus (bunionectomy) more Covered/Approved 07/15/2020

## 2020-07-18 DIAGNOSIS — M2011 Hallux valgus (acquired), right foot: Secondary | ICD-10-CM

## 2020-07-18 DIAGNOSIS — M2041 Other hammer toe(s) (acquired), right foot: Secondary | ICD-10-CM

## 2020-07-18 DIAGNOSIS — M21541 Acquired clubfoot, right foot: Secondary | ICD-10-CM

## 2020-07-24 ENCOUNTER — Other Ambulatory Visit: Payer: Self-pay

## 2020-07-24 ENCOUNTER — Ambulatory Visit (INDEPENDENT_AMBULATORY_CARE_PROVIDER_SITE_OTHER): Payer: 59

## 2020-07-24 ENCOUNTER — Ambulatory Visit (INDEPENDENT_AMBULATORY_CARE_PROVIDER_SITE_OTHER): Payer: 59 | Admitting: Podiatry

## 2020-07-24 ENCOUNTER — Encounter: Payer: Self-pay | Admitting: Podiatry

## 2020-07-24 DIAGNOSIS — Z9889 Other specified postprocedural states: Secondary | ICD-10-CM

## 2020-07-24 DIAGNOSIS — M2041 Other hammer toe(s) (acquired), right foot: Secondary | ICD-10-CM

## 2020-07-24 DIAGNOSIS — M2011 Hallux valgus (acquired), right foot: Secondary | ICD-10-CM

## 2020-07-24 MED ORDER — OXYCODONE-ACETAMINOPHEN 10-325 MG PO TABS: 1.0000 | ORAL_TABLET | Freq: Three times a day (TID) | ORAL | 0 refills | Status: DC | PRN

## 2020-07-24 NOTE — Progress Notes (Signed)
  Subjective:  Patient ID: Chad Brooks, male    DOB: 1966/07/15,  MRN: [id number]  Chief Complaint  Patient presents with  . Routine Post Op    POV #1 DOS 07/18/20 RT FOOT [place] YOUNGSWICK W/SCREW FIXATION, 2ND METATARSAL OSTEOTOMY W/ SCREW FIXATION, HAMMERTOE 2ND TOE W/ KWIRE FIXATION     54 y.o. male returns for post-op check.  Patient is doing well.  No acute complaints.  He states that he is taking his pain medication which is well controlled.  He is about to run out he would like some more.  He denies any other acute complaints.  Review of Systems: Negative except as noted in the HPI. Denies N/V/F/Ch.  Past Medical History:  Diagnosis Date  . Allergy   . Hypertension     Current Outpatient Medications:  .  amLODipine (NORVASC) 5 MG tablet, Take 5 mg by mouth daily., Disp: , Rfl:  .  aspirin 81 MG EC tablet, 1 tablet, Disp: , Rfl:  .  cephALEXin (KEFLEX) 500 MG capsule, Take 1 capsule (500 mg total) by mouth 3 (three) times daily., Disp: 30 capsule, Rfl: 0 .  meloxicam (MOBIC) 15 MG tablet, Take 15 mg by mouth daily., Disp: , Rfl:  .  Multiple Vitamin (ONE-A-DAY MENS PO), Take by mouth., Disp: , Rfl:  .  ondansetron (ZOFRAN) 4 MG tablet, Take 1 tablet (4 mg total) by mouth every 8 (eight) hours as needed., Disp: 20 tablet, Rfl: 0 .  oxyCODONE-acetaminophen (PERCOCET) 10-325 MG tablet, Take 1 tablet by mouth every 8 (eight) hours as needed for up to 7 days for pain., Disp: 21 tablet, Rfl: 0 .  rosuvastatin (CRESTOR) 10 MG tablet, Take 10 mg by mouth daily., Disp: , Rfl:  .  telmisartan (MICARDIS) 40 MG tablet, telmisartan 40 mg tablet, Disp: , Rfl:  .  vitamin E 1000 UNIT capsule, Take 1,000 Units by mouth daily., Disp: , Rfl:   Social History   Tobacco Use  Smoking Status Never Smoker  Smokeless Tobacco Never Used    No Known Allergies Objective:  There were no vitals filed for this visit. There is no height or weight on file to calculate BMI. Constitutional Well  developed. Well nourished.  Vascular Foot warm and well perfused. Capillary refill normal to all digits.   Neurologic Normal speech. Oriented to person, place, and time. Epicritic sensation to light touch grossly present bilaterally.  Dermatologic Skin healing well without signs of infection. Skin edges well coapted without signs of infection.  Orthopedic: Tenderness to palpation noted about the surgical site.   Radiographs: 3 views of skeletally mature adult right foot: Good correction alignment noted.  Hardware is intact no signs of loosening or backing out noted. Assessment:   1. Hallux valgus of right foot   2. Hammer toe of right foot   3. Status post foot surgery    Plan:  Patient was evaluated and treated and all questions answered.  S/p foot surgery right -Progressing as expected post-operatively. -XR: See above -WB Status: Weightbearing as tolerated in boot -Sutures: Intact.  No signs of dehiscence noted.  No clinical signs of infection noted.  Continue taking and complete the course of antibiotics -Medications: Percocet refilled -Foot redressed.  No follow-ups on file.

## 2020-07-31 ENCOUNTER — Ambulatory Visit (INDEPENDENT_AMBULATORY_CARE_PROVIDER_SITE_OTHER): Payer: 59 | Admitting: Podiatry

## 2020-07-31 ENCOUNTER — Other Ambulatory Visit: Payer: Self-pay

## 2020-07-31 ENCOUNTER — Encounter: Payer: Self-pay | Admitting: Podiatry

## 2020-07-31 DIAGNOSIS — M2011 Hallux valgus (acquired), right foot: Secondary | ICD-10-CM

## 2020-07-31 DIAGNOSIS — Z9889 Other specified postprocedural states: Secondary | ICD-10-CM

## 2020-07-31 DIAGNOSIS — M2041 Other hammer toe(s) (acquired), right foot: Secondary | ICD-10-CM

## 2020-07-31 MED ORDER — OXYCODONE-ACETAMINOPHEN 10-325 MG PO TABS: 1.0000 | ORAL_TABLET | Freq: Three times a day (TID) | ORAL | 0 refills | Status: AC | PRN

## 2020-07-31 NOTE — Progress Notes (Signed)
He presents today for his second postop visit date of surgery is 07/18/2020 right foot [place] bunion and screw fixation second metatarsal osteotomy with screw fixation and a hammertoe repair with pin fixation.  He denies fever chills nausea vomiting muscle aches and pain states that his brother all died from Covid and he is will have to go to the funeral so he would like more pain medicine if he can have it.  Objective: Vital signs are stable he is alert oriented x3 pulses are palpable moderate edema to the right lower extremity.  K wires in place.  Sutures are intact margins are coapted with him going to be up on his foot this weekend I do not want to take the sutures out a risk of dehiscence.  Assessment: Well-healing surgical foot x2 weeks  Plan: Continue the use of his cam walker at all times keep this dry clean elevated is much as possible and I will refill his pain medication.  Follow-up with him in 1 week at which time we hope to be able to get the sutures out.

## 2020-08-07 ENCOUNTER — Encounter: Payer: Self-pay | Admitting: Podiatry

## 2020-08-07 ENCOUNTER — Ambulatory Visit (INDEPENDENT_AMBULATORY_CARE_PROVIDER_SITE_OTHER): Payer: 59 | Admitting: Podiatry

## 2020-08-07 ENCOUNTER — Other Ambulatory Visit: Payer: Self-pay

## 2020-08-07 ENCOUNTER — Ambulatory Visit (INDEPENDENT_AMBULATORY_CARE_PROVIDER_SITE_OTHER): Payer: 59

## 2020-08-07 DIAGNOSIS — M2011 Hallux valgus (acquired), right foot: Secondary | ICD-10-CM

## 2020-08-07 DIAGNOSIS — Z9889 Other specified postprocedural states: Secondary | ICD-10-CM

## 2020-08-07 DIAGNOSIS — M2041 Other hammer toe(s) (acquired), right foot: Secondary | ICD-10-CM

## 2020-08-07 NOTE — Progress Notes (Addendum)
He presents today date of surgery 07/18/2020 status post Chad Brooks bunionectomy with screw and second metatarsal osteotomy with screw and a hammertoe repair second with K wire.  Objective: Vital signs are stable alert and oriented x3.  Vital signs are stable alert oriented x3.  Pulses are palpable.  Great range of motion first metatarsophalangeal joint K wires retained to the metatarsal and toe.  No signs of infection.  Assessment well-healing surgical foot.  Plan: Follow-up with me in 2 weeks for pin removal.

## 2020-08-14 ENCOUNTER — Encounter: Payer: 59 | Admitting: Podiatry

## 2020-08-28 ENCOUNTER — Ambulatory Visit (INDEPENDENT_AMBULATORY_CARE_PROVIDER_SITE_OTHER): Payer: 59 | Admitting: Podiatry

## 2020-08-28 ENCOUNTER — Ambulatory Visit (INDEPENDENT_AMBULATORY_CARE_PROVIDER_SITE_OTHER): Payer: 59

## 2020-08-28 ENCOUNTER — Other Ambulatory Visit: Payer: Self-pay

## 2020-08-28 ENCOUNTER — Encounter: Payer: Self-pay | Admitting: Podiatry

## 2020-08-28 DIAGNOSIS — M2041 Other hammer toe(s) (acquired), right foot: Secondary | ICD-10-CM

## 2020-08-28 DIAGNOSIS — M2011 Hallux valgus (acquired), right foot: Secondary | ICD-10-CM

## 2020-08-28 DIAGNOSIS — Z9889 Other specified postprocedural states: Secondary | ICD-10-CM

## 2020-08-30 NOTE — Progress Notes (Addendum)
He presents today for third postop visit date of surgery 07/18/2020 [facility] bunionectomy with screw fixation and second metatarsal osteotomy with screw fixation.  K wires retained to the second toe.  He states that is not having too much discomfort overall states that he continues to move the hallux on a regular basis.  Objective: Vital signs are stable he is alert and oriented x3.  Pulses are palpable.  There is mild edema no erythema cellulitis drainage or odor his pain is retained to the toe second right which I removed today.  There is no purulence no malodor toe is rectus and appears to be fused at the level of the second PIPJ.  He has good range of motion of the first metatarsophalangeal joint though it is a little bit tight on plantarflexion.  Radiographs taken today do demonstrate the first metatarsal osteotomy appears to be healing as does the second internal fixation is in good position.  Wires retained at the level of the PIPJ fusion appears to be complete.  Assessment: Well-healing surgical foot.  Plan: Removal of the K wire today ambulating back into regular shoes and I will follow-up with him in 1 month.

## 2020-09-11 ENCOUNTER — Ambulatory Visit (INDEPENDENT_AMBULATORY_CARE_PROVIDER_SITE_OTHER): Payer: 59

## 2020-09-11 ENCOUNTER — Other Ambulatory Visit: Payer: Self-pay

## 2020-09-11 ENCOUNTER — Ambulatory Visit (INDEPENDENT_AMBULATORY_CARE_PROVIDER_SITE_OTHER): Payer: 59 | Admitting: Podiatry

## 2020-09-11 ENCOUNTER — Encounter: Payer: Self-pay | Admitting: Podiatry

## 2020-09-11 DIAGNOSIS — M2041 Other hammer toe(s) (acquired), right foot: Secondary | ICD-10-CM

## 2020-09-11 DIAGNOSIS — Z9889 Other specified postprocedural states: Secondary | ICD-10-CM

## 2020-09-11 DIAGNOSIS — M2011 Hallux valgus (acquired), right foot: Secondary | ICD-10-CM

## 2020-09-13 NOTE — Progress Notes (Addendum)
He presents today for postop visit date of surgery 07/18/2020 status post [facility] bunionectomy with screw fixation second metatarsal osteotomy with screw fixation.  States that is feeling better is still swollen I can only get on 1 pair of sneakers.  The need to be able to get back into my regular shoes for work.  Objective: Vital signs are stable alert and oriented x3.  Pulses are palpable.  He has good range of motion of the first metatarsophalangeal joint but still has considerable swelling.  Radiographs taken today demonstrate moderate edema overlying the dorsal first metatarsal and first intermetatarsal space.  The osteotomy sites appear to be healing well internal fixation appears to be in good position and has not moved.  Assessment: Well-healing surgical foot.  Plan: At this point were going to send him to physical therapy and request increase range of motion and help decrease the edema and increase his regular heel-to-toe gait so that we may be able to get him back to work quicker.

## 2020-09-14 ENCOUNTER — Encounter: Payer: Self-pay | Admitting: Podiatry

## 2020-10-02 ENCOUNTER — Encounter: Payer: Self-pay | Admitting: Podiatry

## 2020-10-02 ENCOUNTER — Other Ambulatory Visit: Payer: Self-pay

## 2020-10-02 ENCOUNTER — Ambulatory Visit (INDEPENDENT_AMBULATORY_CARE_PROVIDER_SITE_OTHER): Payer: 59 | Admitting: Podiatry

## 2020-10-02 ENCOUNTER — Ambulatory Visit (INDEPENDENT_AMBULATORY_CARE_PROVIDER_SITE_OTHER): Payer: 59

## 2020-10-02 DIAGNOSIS — M2041 Other hammer toe(s) (acquired), right foot: Secondary | ICD-10-CM

## 2020-10-02 DIAGNOSIS — M2011 Hallux valgus (acquired), right foot: Secondary | ICD-10-CM | POA: Diagnosis not present

## 2020-10-02 DIAGNOSIS — Z9889 Other specified postprocedural states: Secondary | ICD-10-CM

## 2020-10-02 MED ORDER — OXYCODONE-ACETAMINOPHEN 10-325 MG PO TABS: 1.0000 | ORAL_TABLET | Freq: Three times a day (TID) | ORAL | 0 refills | Status: AC | PRN

## 2020-10-04 NOTE — Progress Notes (Signed)
He presents today date of surgery 07/18/2020 right foot Frans[facility] Zwick osteotomy with screw fixation second metatarsal osteotomy with screw fixation hammertoe repair with K wire fixation.  He states that is feeling better is still sore to bend occasionally though.  When asked him if he went to physical therapy he states that they only called him last week and that he expressed to them he will be seeing [place] today so he held off on any therapy until he talk to [place].  He would like to get back to work next week.  Objective: Vital signs are stable he is alert oriented x3.  Pulses are palpable.  There is minimal edema no erythema cellulitis drainage or odor he has great range of motion dorsiflexion and plantarflexion both first and second metatarsophalangeal joints.  He still has some tenderness on range of motion.  Radiographs demonstrating internal fixation is intact and appears to be tight still.  He does have some joint space narrowing at the first metatarsophalangeal joint most likely succumbing to some degenerative joint disease.  Assessment: Well-healing surgical foot.  Plan: Allow him to get back to work I think the exercise and walking would be good for him.  I did recommend that he go ahead and visit physical therapy and let them at least give him some home exercise program today.  They will assess that and he will follow up with me again in few weeks.

## 2020-11-18 ENCOUNTER — Other Ambulatory Visit: Payer: Self-pay

## 2020-11-18 ENCOUNTER — Ambulatory Visit (INDEPENDENT_AMBULATORY_CARE_PROVIDER_SITE_OTHER): Payer: Self-pay

## 2020-11-18 ENCOUNTER — Ambulatory Visit (INDEPENDENT_AMBULATORY_CARE_PROVIDER_SITE_OTHER): Payer: 59 | Admitting: Podiatry

## 2020-11-18 DIAGNOSIS — M2041 Other hammer toe(s) (acquired), right foot: Secondary | ICD-10-CM

## 2020-11-18 DIAGNOSIS — M2011 Hallux valgus (acquired), right foot: Secondary | ICD-10-CM

## 2020-11-18 DIAGNOSIS — Z9889 Other specified postprocedural states: Secondary | ICD-10-CM

## 2020-11-18 NOTE — Progress Notes (Signed)
He presents today date of surgery 07/18/2020 bunion repair second metatarsal osteotomy hammertoe repair with screw/pin.  He states that he is doing just fine has finished up physical therapy seems to have made a difference.  States that only the second toe gives him a little bit of grief occasionally other than that seems to be doing pretty well.  Objective: Vital signs are stable alert and oriented x3.  Minimal edema no erythema cellulitis drainage or odor great range of motion dorsiflexion and plantarflexion he does have some crepitation on range of motion of the second toe though radiographically screws do not appear to be interfering with the joint.  Assessment: Internal fixation is in good position well-healed osteotomies.  Well-healed surgical foot.  Plan: Follow-up with me on as-needed basis

## 2021-09-10 ENCOUNTER — Ambulatory Visit: Payer: BC Managed Care – PPO | Admitting: Podiatry

## 2021-09-10 ENCOUNTER — Encounter: Payer: Self-pay | Admitting: Podiatry

## 2021-09-10 ENCOUNTER — Other Ambulatory Visit: Payer: Self-pay

## 2021-09-10 ENCOUNTER — Ambulatory Visit (INDEPENDENT_AMBULATORY_CARE_PROVIDER_SITE_OTHER): Payer: BC Managed Care – PPO

## 2021-09-10 DIAGNOSIS — M722 Plantar fascial fibromatosis: Secondary | ICD-10-CM

## 2021-09-10 MED ORDER — TRIAMCINOLONE ACETONIDE 40 MG/ML IJ SUSP
20.0000 mg | Freq: Once | INTRAMUSCULAR | Status: AC
  Administered 2021-09-10: 20 mg

## 2021-09-10 MED ORDER — METHYLPREDNISOLONE 4 MG PO TBPK: ORAL_TABLET | ORAL | 0 refills | Status: DC

## 2021-09-10 NOTE — Progress Notes (Signed)
  Subjective:  Patient ID: Chad Brooks, male    DOB: [birth year],  MRN: [id number] HPI Chief Complaint  Patient presents with   Foot Pain    Plantar heel left - aching x 6 months, AM pain, tried old pain meds from right foot surgery, also tried Aleve and Advil    55 y.o. male presents with the above complaint.   ROS: Denies fever chills nausea vomiting muscle aches pains calf pain back pain chest pain shortness of breath.  Past Medical History:  Diagnosis Date   Allergy    Hypertension    Past Surgical History:  Procedure Laterality Date   ANTERIOR CRUCIATE LIGAMENT REPAIR     ROTATOR CUFF REPAIR      Current Outpatient Medications:    methylPREDNISolone (MEDROL DOSEPAK) 4 MG TBPK tablet, 6 day dose pack - take as directed, Disp: 21 tablet, Rfl: 0   amLODipine (NORVASC) 5 MG tablet, Take 5 mg by mouth daily., Disp: , Rfl:    aspirin 81 MG EC tablet, 1 tablet, Disp: , Rfl:    meloxicam (MOBIC) 15 MG tablet, Take 15 mg by mouth daily., Disp: , Rfl:    Multiple Vitamin (ONE-A-DAY MENS PO), Take by mouth., Disp: , Rfl:    rosuvastatin (CRESTOR) 10 MG tablet, Take 10 mg by mouth daily., Disp: , Rfl:    telmisartan (MICARDIS) 40 MG tablet, telmisartan 40 mg tablet, Disp: , Rfl:    telmisartan (MICARDIS) 80 MG tablet, Take 80 mg by mouth daily., Disp: , Rfl:    vitamin E 1000 UNIT capsule, Take 1,000 Units by mouth daily., Disp: , Rfl:   No Known Allergies Review of Systems Objective:  There were no vitals filed for this visit.  General: Well developed, nourished, in no acute distress, alert and oriented x3   Dermatological: Skin is warm, dry and supple bilateral. Nails x 10 are well maintained; remaining integument appears unremarkable at this time. There are no open sores, no preulcerative lesions, no rash or signs of infection present.  Vascular: Dorsalis Pedis artery and Posterior Tibial artery pedal pulses are 2/4 bilateral with immedate capillary fill time. Pedal hair  growth present. No varicosities and no lower extremity edema present bilateral.   Neruologic: Grossly intact via light touch bilateral. Vibratory intact via tuning fork bilateral. Protective threshold with Semmes Wienstein monofilament intact to all pedal sites bilateral. Patellar and Achilles deep tendon reflexes 2+ bilateral. No Babinski or clonus noted bilateral.   Musculoskeletal: No gross boney pedal deformities bilateral. No pain, crepitus, or limitation noted with foot and ankle range of motion bilateral. Muscular strength 5/5 in all groups tested bilateral.  Severe pain on palpation medial calcaneal tubercle of the left foot.  Gait: Unassisted, Nonantalgic.    Radiographs:  Radiographs taken today demonstrate small plantar distally oriented calcaneal heel spur soft tissue increase in density plantar fascial kidney insertion site.  Assessment & Plan:   Assessment: Planter fasciitis left.  Plan: Discussed etiology pathology conservative or surgical therapies.  Start him on methylprednisolone he will then follow-up with the mobic that he has at home.  Also injected the heel today 20 mg Kenalog 5 mg Marcaine point of maximal tenderness.  Placed him in a plantar fascial brace and a night splint discussed appropriate shoe gear stretching exercise ice therapy sugar modifications would like to follow-up with him in 1 month     Britt Theard T. [place], [place]

## 2021-09-10 NOTE — Patient Instructions (Signed)

## 2021-09-26 ENCOUNTER — Telehealth: Payer: BC Managed Care – PPO | Admitting: Nurse Practitioner

## 2021-09-26 DIAGNOSIS — U071 COVID-19: Secondary | ICD-10-CM | POA: Diagnosis not present

## 2021-09-26 MED ORDER — PROMETHAZINE-DM 6.25-15 MG/5ML PO SYRP: 5.0000 mL | ORAL_SOLUTION | Freq: Four times a day (QID) | ORAL | 0 refills | Status: DC | PRN

## 2021-09-26 MED ORDER — MOLNUPIRAVIR EUA 200MG CAPSULE: 4.0000 | ORAL_CAPSULE | Freq: Two times a day (BID) | ORAL | 0 refills | Status: AC

## 2021-09-26 NOTE — Patient Instructions (Signed)
Chad Brooks, thank you for joining Gildardo Pounds, NP for today's virtual visit.  While this provider is not your primary care provider (PCP), if your PCP is located in our provider database this encounter information will be shared with them immediately following your visit.  Consent: (Patient) Chad Brooks provided verbal consent for this virtual visit at the beginning of the encounter.  Current Medications:  Current Outpatient Medications:    molnupiravir EUA (LAGEVRIO) 200 mg CAPS capsule, Take 4 capsules (800 mg total) by mouth 2 (two) times daily for 5 days., Disp: 40 capsule, Rfl: 0   promethazine-dextromethorphan (PROMETHAZINE-DM) 6.25-15 MG/5ML syrup, Take 5 mLs by mouth 4 (four) times daily as needed for cough., Disp: 240 mL, Rfl: 0   amLODipine (NORVASC) 5 MG tablet, Take 5 mg by mouth daily., Disp: , Rfl:    aspirin 81 MG EC tablet, 1 tablet, Disp: , Rfl:    meloxicam (MOBIC) 15 MG tablet, Take 15 mg by mouth daily., Disp: , Rfl:    methylPREDNISolone (MEDROL DOSEPAK) 4 MG TBPK tablet, 6 day dose pack - take as directed, Disp: 21 tablet, Rfl: 0   Multiple Vitamin (ONE-A-DAY MENS PO), Take by mouth., Disp: , Rfl:    rosuvastatin (CRESTOR) 10 MG tablet, Take 10 mg by mouth daily., Disp: , Rfl:    telmisartan (MICARDIS) 40 MG tablet, telmisartan 40 mg tablet, Disp: , Rfl:    telmisartan (MICARDIS) 80 MG tablet, Take 80 mg by mouth daily., Disp: , Rfl:    vitamin E 1000 UNIT capsule, Take 1,000 Units by mouth daily., Disp: , Rfl:    Medications ordered in this encounter:  Meds ordered this encounter  Medications   molnupiravir EUA (LAGEVRIO) 200 mg CAPS capsule    Sig: Take 4 capsules (800 mg total) by mouth 2 (two) times daily for 5 days.    Dispense:  40 capsule    Refill:  0    Order Specific Question:   Supervising Provider    Answer:   Noemi Chapel [[id number]]   promethazine-dextromethorphan (PROMETHAZINE-DM) 6.25-15 MG/5ML syrup    Sig: Take 5 mLs by mouth 4 (four) times  daily as needed for cough.    Dispense:  240 mL    Refill:  0    Order Specific Question:   Supervising Provider    Answer:   Sabra Heck, BRIAN [[id number]]     *If you need refills on other medications prior to your next appointment, please contact your pharmacy*  Follow-Up: Call back or seek an in-person evaluation if the symptoms worsen or if the condition fails to improve as anticipated.  Other Instructions Please keep well-hydrated and get plenty of rest. Start a saline nasal rinse to flush out your nasal passages. You can use plain Mucinex to help thin congestion. If you have a humidifier, running in the bedroom at night. I want you to start OTC vitamin D3 1000 units daily, vitamin C 1000 mg daily, and a zinc supplement. Please take prescribed medications as directed.   You have been enrolled in a MyChart symptom monitoring program. Please answer these questions daily so we can keep track of how you are doing.   If PCR test positive, please see quarantine instructions below. I also want you to message me via MyChart, using the separate message I have sent you. This way you can communicate directly with me.    You were to quarantine for 5 days from onset of your symptoms.  After day 5,  if you have had no fever and you are feeling better, you can end quarantine but need to mask for an additional 5 days. After day 5 if you have a fever or are having significant symptoms, please quarantine for full 10 days.   If you note any worsening of symptoms, any significant shortness of breath or any chest pain, please seek ER evaluation ASAP.  Please do not delay care!    If you have been instructed to have an in-person evaluation today at a local Urgent Care facility, please use the link below. It will take you to a list of all of our available [facility], including address, phone number and hours of operation. Please do not delay care.  [facility]  If you or a family  member do not have a primary care provider, use the link below to schedule a visit and establish care. When you choose a [facility] primary care physician or advanced practice provider, you gain a long-term partner in health. Find a Primary Care Provider  Learn more about [facility]'s in-office and virtual care options: [facility]

## 2021-09-26 NOTE — Progress Notes (Signed)
Virtual Visit Consent   Chad Brooks, you are scheduled for a virtual visit with a [facility] provider today.     Just as with appointments in the office, your consent must be obtained to participate.  Your consent will be active for this visit and any virtual visit you may have with one of our providers in the next 365 days.     If you have a MyChart account, a copy of this consent can be sent to you electronically.  All virtual visits are billed to your insurance company just like a traditional visit in the office.    As this is a virtual visit, video technology does not allow for your provider to perform a traditional examination.  This may limit your provider's ability to fully assess your condition.  If your provider identifies any concerns that need to be evaluated in person or the need to arrange testing (such as labs, EKG, etc.), we will make arrangements to do so.     Although advances in technology are sophisticated, we cannot ensure that it will always work on either your end or our end.  If the connection with a video visit is poor, the visit may have to be switched to a telephone visit.  With either a video or telephone visit, we are not always able to ensure that we have a secure connection.     I need to obtain your verbal consent now.   Are you willing to proceed with your visit today?    Chad Brooks has provided verbal consent on 09/26/2021 for a virtual visit (video or telephone).   Chad Pounds, NP   Date: 09/26/2021 1:22 PM   Virtual Visit via Video Note   I, Chad Brooks, connected with  Chad Brooks  ([id number], 1966-05-12) on 09/26/21 at  1:45 PM EST by a video-enabled telemedicine application and verified that I am speaking with the correct person using two identifiers.  Location: Patient: Virtual Visit Location Patient: Home Provider: Virtual Visit Location Provider: Home Office   I discussed the limitations of evaluation and management by  telemedicine and the availability of in person appointments. The patient expressed understanding and agreed to proceed.    History of Present Illness: Chad Brooks is a 55 y.o. who identifies as a male who was assigned male at birth, and is being seen today for COVID POSITIVE TEST and symptoms.  HPI:  Tested positive for COVID today. Current symptoms include: stuffy head, sore throat, chest and nasal congestion, fatigue and headache.   Problems:  Patient Active Problem List   Diagnosis Date Noted   Encounter for postoperative care 01/06/2018   Mass of hand 12/26/2017   Pain of left hand 12/05/2017    Allergies: No Known Allergies Medications:  Current Outpatient Medications:    molnupiravir EUA (LAGEVRIO) 200 mg CAPS capsule, Take 4 capsules (800 mg total) by mouth 2 (two) times daily for 5 days., Disp: 40 capsule, Rfl: 0   promethazine-dextromethorphan (PROMETHAZINE-DM) 6.25-15 MG/5ML syrup, Take 5 mLs by mouth 4 (four) times daily as needed for cough., Disp: 240 mL, Rfl: 0   amLODipine (NORVASC) 5 MG tablet, Take 5 mg by mouth daily., Disp: , Rfl:    aspirin 81 MG EC tablet, 1 tablet, Disp: , Rfl:    meloxicam (MOBIC) 15 MG tablet, Take 15 mg by mouth daily., Disp: , Rfl:    methylPREDNISolone (MEDROL DOSEPAK) 4 MG TBPK tablet, 6 day dose pack -  take as directed, Disp: 21 tablet, Rfl: 0   Multiple Vitamin (ONE-A-DAY MENS PO), Take by mouth., Disp: , Rfl:    rosuvastatin (CRESTOR) 10 MG tablet, Take 10 mg by mouth daily., Disp: , Rfl:    telmisartan (MICARDIS) 40 MG tablet, telmisartan 40 mg tablet, Disp: , Rfl:    telmisartan (MICARDIS) 80 MG tablet, Take 80 mg by mouth daily., Disp: , Rfl:    vitamin E 1000 UNIT capsule, Take 1,000 Units by mouth daily., Disp: , Rfl:   Observations/Objective: Patient is well-developed, well-nourished in no acute distress.  Resting comfortably at home.  Head is normocephalic, atraumatic.  No labored breathing.  Speech is clear and coherent with  logical content.  Patient is alert and oriented at baseline.    Assessment and Plan: 1. Positive self-administered antigen test for COVID-19 - molnupiravir EUA (LAGEVRIO) 200 mg CAPS capsule; Take 4 capsules (800 mg total) by mouth 2 (two) times daily for 5 days.  Dispense: 40 capsule; Refill: 0 - promethazine-dextromethorphan (PROMETHAZINE-DM) 6.25-15 MG/5ML syrup; Take 5 mLs by mouth 4 (four) times daily as needed for cough.  Dispense: 240 mL; Refill: 0   Follow Up Instructions: I discussed the assessment and treatment plan with the patient. The patient was provided an opportunity to ask questions and all were answered. The patient agreed with the plan and demonstrated an understanding of the instructions.  A copy of instructions were sent to the patient via MyChart unless otherwise noted below.    The patient was advised to call back or seek an in-person evaluation if the symptoms worsen or if the condition fails to improve as anticipated.  Time:  I spent 10 minutes with the patient via telehealth technology discussing the above problems/concerns.    Chad Pounds, NP

## 2021-10-13 ENCOUNTER — Other Ambulatory Visit: Payer: Self-pay

## 2021-10-13 ENCOUNTER — Encounter: Payer: Self-pay | Admitting: Podiatry

## 2021-10-13 ENCOUNTER — Ambulatory Visit: Payer: BC Managed Care – PPO | Admitting: Podiatry

## 2021-10-13 ENCOUNTER — Ambulatory Visit: Payer: BC Managed Care – PPO

## 2021-10-13 DIAGNOSIS — M722 Plantar fascial fibromatosis: Secondary | ICD-10-CM

## 2021-10-13 DIAGNOSIS — M778 Other enthesopathies, not elsewhere classified: Secondary | ICD-10-CM

## 2021-10-13 MED ORDER — TRIAMCINOLONE ACETONIDE 40 MG/ML IJ SUSP
20.0000 mg | Freq: Once | INTRAMUSCULAR | Status: AC
  Administered 2021-10-13: 20 mg

## 2021-10-14 NOTE — Progress Notes (Signed)
He presents today for follow-up of his plantar fasciitis.  He states this only about 30% improved at this point.  Continues his other medications.  Objective: Vital signs are stable alert and oriented x3.  Plan palpation medial calcaneal tubercle of his left heel.  Assessment: Chronic intractable Planter fasciitis left.  Plan: At this point we are will reinject him today 20 mg Kenalog 5 mg Marcaine he will continue use of his meloxicam we had him see Aaron Edelman today for orthotics and I will follow-up with him once those come in.

## 2021-10-14 NOTE — Progress Notes (Signed)
SITUATION Reason for Consult: Evaluation for Bilateral Custom Foot Orthoses Patient / Caregiver Report: Patient is ready for foot orthotics  OBJECTIVE DATA: Patient History / Diagnosis: Plantar fasciitis of left foot  Capsulitis of foot, right  Current or Previous Devices: None  Foot Examination: Skin presentation:   Intact Ulcers & Callousing:   None and no history Toe / Foot Deformities:  Hammertoes, prominent met heads Weight Bearing Presentation:  Rectus Sensation:    Intact  ORTHOTIC RECOMMENDATION Recommended Device: 1x pair of custom functional foot orthotics  GOALS OF ORTHOSES - Reduce Pain - Prevent Foot Deformity - Prevent Progression of Further Foot Deformity - Relieve Pressure - Improve the Overall Biomechanical Function of the Foot and Lower Extremity.  ACTIONS PERFORMED Patient was casted for Foot Orthoses via crush box. Procedure was explained and patient tolerated procedure well. All questions were answered and concerns addressed.  PLAN Potential out of pocket cost was communicated to patient. Casts are to be sent to [facility] for fabrication. Patient is to be called for fitting when devices are ready.

## 2021-11-10 ENCOUNTER — Other Ambulatory Visit: Payer: Self-pay

## 2021-11-10 ENCOUNTER — Ambulatory Visit: Payer: BC Managed Care – PPO

## 2021-11-10 DIAGNOSIS — M778 Other enthesopathies, not elsewhere classified: Secondary | ICD-10-CM

## 2021-11-10 DIAGNOSIS — M722 Plantar fascial fibromatosis: Secondary | ICD-10-CM

## 2021-11-10 DIAGNOSIS — M2041 Other hammer toe(s) (acquired), right foot: Secondary | ICD-10-CM

## 2021-11-10 DIAGNOSIS — M2011 Hallux valgus (acquired), right foot: Secondary | ICD-10-CM

## 2021-11-10 NOTE — Progress Notes (Signed)
SITUATION: Reason for Visit: Fitting and Delivery of Custom Fabricated Foot Orthoses Patient Report: Patient reports comfort and is satisfied with device.  OBJECTIVE DATA: Patient History / Diagnosis:     ICD-10-CM   1. Capsulitis of foot, right  M77.8     2. Plantar fasciitis of left foot  M72.2     3. Hallux valgus of right foot  M20.11     4. Hammer toe of right foot  M20.41       Provided Device:  Functional foot orthotics  GOAL OF ORTHOSIS - Improve gait - Decrease energy expenditure - Improve Balance - Provide Triplanar stability of foot complex - Facilitate motion  ACTIONS PERFORMED Patient was fit with foot orthotics trimmed to shoe last. Patient tolerated fittign procedure. Device was modified as follows to better fit patient: - Toe plate was trimmed to shoe last  Patient was provided with verbal and written instruction and demonstration regarding donning, doffing, wear, care, proper fit, function, purpose, cleaning, and use of the orthosis and in all related precautions and risks and benefits regarding the orthosis.  Patient was also provided with verbal instruction regarding how to report any failures or malfunctions of the orthosis and necessary follow up care. Patient was also instructed to contact our office regarding any change in status that may affect the function of the orthosis.  Patient demonstrated independence with proper donning, doffing, and fit and verbalized understanding of all instructions.  PLAN: Patient is to follow up in one week or as necessary (PRN). All questions were answered and concerns addressed. Plan of care was discussed with and agreed upon by the patient.

## 2022-08-11 NOTE — Progress Notes (Signed)
   I, Peterson Lombard, LAT, ATC acting as a scribe for Lynne Leader, MD.  Subjective:    CC: L elbow pain  HPI: Patient is a 56 year old male complaining of L elbow pain x 6 months. Pt reports not being able to fully extend L elbow. Pt notes a hx of a prior L shoulder surgery.  Pt only has pain when trying to fully straighten/extend elbow along the posterior aspect of the L elbow. Pt works as a Dealer for [employer]. Prior pain was located along the medial and lateral aspects of the L elbow, but resolved by wearing an elbow strap.  His main issue today is that he cannot extend his elbow fully.  He lacks the last few degrees of extension.  He does not have much if any pain.  Radiates: no UE numbness/tingling: no UE weakness:no Aggravates: trying to extend elbow Treatments tried: none  Pertinent review of Systems: No fevers or chills  Relevant historical information: Hyperlipidemia   Objective:    Vitals:   08/12/22 1524  BP: 128/84  Pulse: 67  SpO2: 96%   General: Well Developed, well nourished, and in no acute distress.   MSK: Left elbow: Normal appearing Nontender. Range of motion full flexion supination and pronation.  Lacks full extension by about 3 degrees. Intact strength. Pulses capillary refill and sensation are intact distally.  Lab and Radiology Results  X-ray images left elbow obtained today personally and independently interpreted. Posterior spur from olecranon is present.  Mild degenerative changes are present. Await formal radiology review     Impression and Recommendations:    Assessment and Plan: 56 y.o. male with a elbow stiffness or lack of range of motion.  He does not have a lot of pain currently which is reassuring.  He has good strength..  I suspect the spur from the olecranon is interfering with full extension.  Clinically his symptoms are probably not severe enough that surgery would make sense.  After discussion he agrees.  Plan  for watchful waiting for now.  Check back as needed.  PDMP not reviewed this encounter. Orders Placed This Encounter  Procedures   DG ELBOW COMPLETE LEFT (3+VIEW)    Standing Status:   Future    Number of Occurrences:   1    Standing Expiration Date:   08/13/2023    Order Specific Question:   Reason for Exam (SYMPTOM  OR DIAGNOSIS REQUIRED)    Answer:   left elbow lacks full ROM    Order Specific Question:   Preferred imaging location?    Answer:   [facility]   No orders of the defined types were placed in this encounter.   Discussed warning signs or symptoms. Please see discharge instructions. Patient expresses understanding.   The above documentation has been reviewed and is accurate and complete Lynne Leader, M.D.

## 2022-08-12 ENCOUNTER — Ambulatory Visit (INDEPENDENT_AMBULATORY_CARE_PROVIDER_SITE_OTHER): Payer: BC Managed Care – PPO

## 2022-08-12 ENCOUNTER — Ambulatory Visit: Payer: Self-pay

## 2022-08-12 ENCOUNTER — Ambulatory Visit: Payer: BC Managed Care – PPO | Admitting: Family Medicine

## 2022-08-12 VITALS — BP 128/84 | HR 67 | Ht 67.0 in | Wt 224.0 lb

## 2022-08-12 DIAGNOSIS — G8929 Other chronic pain: Secondary | ICD-10-CM

## 2022-08-12 DIAGNOSIS — M25522 Pain in left elbow: Secondary | ICD-10-CM | POA: Diagnosis not present

## 2022-08-12 NOTE — Patient Instructions (Addendum)
Thank you for coming in today.   Please get an Xray today before you leave   Ok to let this be if xray is not scary.   Keep doing the home exercises.   Recheck as needed.   Happy to see you anytime.

## 2022-08-16 NOTE — Progress Notes (Signed)
Some elbow spurs are present on the x-ray which probably is the source of your lack of range of motion.

## 2023-07-20 ENCOUNTER — Ambulatory Visit: Payer: BC Managed Care – PPO | Admitting: Family Medicine

## 2023-07-20 ENCOUNTER — Encounter: Payer: Self-pay | Admitting: Family Medicine

## 2023-07-20 VITALS — BP 118/74 | HR 88 | Temp 98.7°F | Ht 67.5 in | Wt 227.8 lb

## 2023-07-20 DIAGNOSIS — N400 Enlarged prostate without lower urinary tract symptoms: Secondary | ICD-10-CM | POA: Insufficient documentation

## 2023-07-20 DIAGNOSIS — M180 Bilateral primary osteoarthritis of first carpometacarpal joints: Secondary | ICD-10-CM

## 2023-07-20 DIAGNOSIS — E785 Hyperlipidemia, unspecified: Secondary | ICD-10-CM | POA: Insufficient documentation

## 2023-07-20 DIAGNOSIS — H18519 Endothelial corneal dystrophy, unspecified eye: Secondary | ICD-10-CM | POA: Insufficient documentation

## 2023-07-20 DIAGNOSIS — N401 Enlarged prostate with lower urinary tract symptoms: Secondary | ICD-10-CM | POA: Diagnosis not present

## 2023-07-20 DIAGNOSIS — E782 Mixed hyperlipidemia: Secondary | ICD-10-CM

## 2023-07-20 DIAGNOSIS — R5383 Other fatigue: Secondary | ICD-10-CM | POA: Diagnosis not present

## 2023-07-20 DIAGNOSIS — Z1159 Encounter for screening for other viral diseases: Secondary | ICD-10-CM

## 2023-07-20 DIAGNOSIS — Z87891 Personal history of nicotine dependence: Secondary | ICD-10-CM | POA: Insufficient documentation

## 2023-07-20 DIAGNOSIS — I10 Essential (primary) hypertension: Secondary | ICD-10-CM | POA: Insufficient documentation

## 2023-07-20 DIAGNOSIS — Z8679 Personal history of other diseases of the circulatory system: Secondary | ICD-10-CM | POA: Insufficient documentation

## 2023-07-20 NOTE — Assessment & Plan Note (Signed)
Blood pressure is in good control. Continue amlodipine 5 mg daily and telmisartan 80 mg daily. I will check renal labs today.

## 2023-07-20 NOTE — Assessment & Plan Note (Signed)
Stable.  Continue tadalafil 5 mg daily

## 2023-07-20 NOTE — Assessment & Plan Note (Signed)
I discussed lung cancer screen. I will order a LDCT.

## 2023-07-20 NOTE — Assessment & Plan Note (Signed)
I will check lipids today. Continue rosuvastatin 5 mg daily.

## 2023-07-20 NOTE — Assessment & Plan Note (Signed)
We will continue meloxicam 15 mg daily for now.

## 2023-07-20 NOTE — Progress Notes (Signed)
[facility] [address] [place] [address] Dept: [phone] Dept Fax: [phone]  New Patient Office Visit  Subjective:    Patient ID: Chad Brooks, male    DOB: 07-Aug-1966, 57 y.o..   MRN: [id number]  Chief Complaint  Patient presents with   Establish Care    NP-establish care. C/o feeling fatigue.    History of Present Illness:  Patient is in today to establish care. Chad Brooks was born in [place], [place]. 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. He has been married for 3 years. he has no children. Chad Brooks is a former smoker, having quit in 2011. He had an ~ 22 year history of smoking up to 2 packs per day. He only rarely drinks alcohol and denies drug use.  Chad Brooks has a history of hypertension. He is managed on amlodipine 5 mg daily and telmisartan 80 mg daily.  Chad Brooks has a history of hyperlipidemia. He is managed on rosuvastatin 10 mg daily.  Chad Brooks has a history of arthritis issues at the base of both thumbs. 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 He uses meloxicam 15 mg daily for management.  Chad Brooks has a history of some LUTS. He is managed on tadalafil 5 mg daily.  Past Medical History: Patient Active Problem List   Diagnosis Date Noted   Essential hypertension 07/20/2023   Arthritis of carpometacarpal [facility]) joint of both thumbs 07/20/2023   Hyperlipidemia 07/20/2023   Benign prostatic hyperplasia 07/20/2023   History of tobacco use 07/20/2023   Past Surgical History:  Procedure Laterality Date   ANTERIOR CRUCIATE LIGAMENT REPAIR     BUNIONECTOMY Right    HUMERUS FRACTURE SURGERY Left 12/09/2016   Left humerus repair   KNEE ARTHROSCOPY Left 1993   ROTATOR CUFF REPAIR Right    x 2   Family History  Problem Relation Age of Onset   COPD Mother     Heart disease Mother    Miscarriages / [place] Mother    Vision loss Mother    Hearing loss Father    Peripheral Artery Disease Father    Healthy Sister    Cancer Sister        Breast   Healthy Brother    Birth defects Brother    Heart disease Paternal Uncle    Diabetes Maternal Grandmother    Heart disease Maternal Grandmother    Outpatient Medications Prior to Visit  Medication Sig Dispense Refill   amLODipine (NORVASC) 5 MG tablet Take 5 mg by mouth daily.     Ascorbic Acid (VITAMIN C) 1000 MG tablet Take 1,000 mg by mouth daily.     aspirin 81 MG EC tablet 1 tablet     Cholecalciferol (VITAMIN D3) 250 MCG (10000 UT) TABS      meloxicam (MOBIC) 15 MG tablet Take 15 mg by mouth daily.     Multiple Vitamin (ONE-A-DAY MENS PO) Take by mouth.     rosuvastatin (CRESTOR) 10 MG tablet Take 10 mg by mouth daily.     tadalafil (CIALIS) 5 MG tablet Take 5 mg by mouth daily as needed for erectile dysfunction.     telmisartan (MICARDIS) 80 MG tablet Take 80 mg by mouth daily.     vitamin E 1000 UNIT capsule Take 1,000 Units by mouth daily.  Zinc 50 MG TABS      No facility-administered medications prior to visit.   No Known Allergies Objective:   Today's Vitals   07/20/23 1524  BP: 118/74  Pulse: 88  Temp: 98.7 F (37.1 C)  TempSrc: Temporal  SpO2: 95%  Weight: 227 lb 12.8 oz (103.3 kg)  Height: 5' 7.5" (1.715 m)   Body mass index is 35.15 kg/m.   General: Well developed, well nourished. No acute distress. CV: RRR without murmurs or rubs. Pulses 2+ bilaterally. Psych: Alert and oriented. Normal mood and affect.  Health Maintenance Due  Topic Date Due   HIV Screening  Never done   Hepatitis C Screening  Never done   DTaP/Tdap/Td (1 - Tdap) Never done   Colonoscopy  Never done   Lung Cancer Screening  Never done   Zoster Vaccines- Shingrix (1 of 2) Never done     Assessment & Plan:   Problem List Items Addressed This Visit       Cardiovascular and  Mediastinum   Essential hypertension - Primary    Blood pressure is in good control. Continue amlodipine 5 mg daily and telmisartan 80 mg daily. I will check renal labs today.      Relevant Medications   tadalafil (CIALIS) 5 MG tablet   Other Relevant Orders   Basic metabolic panel     Musculoskeletal and Integument   Arthritis of carpometacarpal (CMC) joint of both thumbs    We will continue meloxicam 15 mg daily for now.        Genitourinary   Benign prostatic hyperplasia    Stable. Continue tadalafil 5 mg daily.      Relevant Orders   PSA     Other   History of tobacco use    I discussed lung cancer screen. I will order a LDCT.      Relevant Orders   CT CHEST LUNG CANCER SCREENING LOW DOSE WO CONTRAST   Hyperlipidemia    I will check lipids today. Continue rosuvastatin 5 mg daily.      Relevant Medications   tadalafil (CIALIS) 5 MG tablet   Other Relevant Orders   Lipid panel   Other Visit Diagnoses     Other fatigue       Relevant Orders   Basic metabolic panel   CBC   TSH   T4, free   VITAMIN D 25 Hydroxy (Vit-D Deficiency, Fractures)   Vitamin B12   Encounter for hepatitis C screening test for low risk patient       Relevant Orders   HCV Ab w Reflex to Quant PCR       Return in about 3 months (around 10/19/2023) for Reassessment.   Loyola Mast, MD

## 2023-07-21 LAB — CBC
HCT: 41.6 % (ref 39.0–52.0)
Hemoglobin: 13.8 g/dL (ref 13.0–17.0)
MCHC: 33.2 g/dL (ref 30.0–36.0)
MCV: 92 fl (ref 78.0–100.0)
Platelets: 174 10*3/uL (ref 150.0–400.0)
RBC: 4.52 Mil/uL (ref 4.22–5.81)
RDW: 13.8 % (ref 11.5–15.5)
WBC: 7.1 10*3/uL (ref 4.0–10.5)

## 2023-07-21 LAB — BASIC METABOLIC PANEL
BUN: 23 mg/dL (ref 6–23)
CO2: 23 mEq/L (ref 19–32)
Calcium: 9.2 mg/dL (ref 8.4–10.5)
Chloride: 104 mEq/L (ref 96–112)
Creatinine, Ser: 1.23 mg/dL (ref 0.40–1.50)
GFR: 65.04 mL/min (ref >60.00)
Glucose, Bld: 99 mg/dL (ref 70–99)
Potassium: 4.3 mEq/L (ref 3.5–5.1)
Sodium: 137 mEq/L (ref 135–145)

## 2023-07-21 LAB — TSH: TSH: 1.17 u[IU]/mL (ref 0.35–5.50)

## 2023-07-21 LAB — LIPID PANEL
Cholesterol: 133 mg/dL (ref 0–200)
HDL: 49 mg/dL (ref >39.00)
LDL Cholesterol: 66 mg/dL (ref 0–99)
NonHDL: 84.31
Total CHOL/HDL Ratio: 3
Triglycerides: 93 mg/dL (ref 0.0–149.0)
VLDL: 18.6 mg/dL (ref 0.0–40.0)

## 2023-07-21 LAB — VITAMIN B12: Vitamin B-12: >1501 pg/mL — ABNORMAL HIGH (ref 211–911)

## 2023-07-21 LAB — HCV INTERPRETATION

## 2023-07-21 LAB — VITAMIN D 25 HYDROXY (VIT D DEFICIENCY, FRACTURES): VITD: 45.8 ng/mL (ref 30.00–100.00)

## 2023-07-21 LAB — PSA: PSA: 0.14 ng/mL (ref 0.10–4.00)

## 2023-07-21 LAB — T4, FREE: Free T4: 0.98 ng/dL (ref 0.60–1.60)

## 2023-07-21 LAB — HCV AB W REFLEX TO QUANT PCR: HCV Ab: NONREACTIVE

## 2023-07-26 ENCOUNTER — Encounter: Payer: Self-pay | Admitting: Family Medicine

## 2023-07-29 ENCOUNTER — Encounter: Payer: Self-pay | Admitting: Nurse Practitioner

## 2023-07-29 ENCOUNTER — Ambulatory Visit (INDEPENDENT_AMBULATORY_CARE_PROVIDER_SITE_OTHER): Payer: BC Managed Care – PPO | Admitting: Nurse Practitioner

## 2023-07-29 VITALS — BP 126/82 | HR 74 | Temp 98.3°F | Wt 222.4 lb

## 2023-07-29 DIAGNOSIS — J209 Acute bronchitis, unspecified: Secondary | ICD-10-CM | POA: Diagnosis not present

## 2023-07-29 MED ORDER — ALBUTEROL SULFATE HFA 108 (90 BASE) MCG/ACT IN AERS
1.0000 | INHALATION_SPRAY | Freq: Four times a day (QID) | RESPIRATORY_TRACT | 0 refills | Status: DC | PRN
Start: 2023-07-29 — End: 2023-10-19

## 2023-07-29 MED ORDER — BENZONATATE 100 MG PO CAPS: 100.0000 mg | ORAL_CAPSULE | Freq: Three times a day (TID) | ORAL | 0 refills | Status: DC | PRN

## 2023-07-29 NOTE — Progress Notes (Signed)
Acute Office Visit  Subjective:    Patient ID: Chad Brooks, male    DOB: Aug 08, 1966, 57 y.o.   MRN: [id number]  Chief Complaint  Patient presents with   Cough    Cough x 1 week. Felt fatigued last Wednesday with chills and sweats. Two at home covid test last week negative.     Cough This is a new problem. The current episode started in the past 7 days. The problem has been unchanged. The cough is Productive of sputum. Pertinent negatives include no chest pain, chills, fever, myalgias, nasal congestion, postnasal drip, rhinorrhea, sore throat, shortness of breath or wheezing. The symptoms are aggravated by lying down. He has tried nothing for the symptoms. There is no history of asthma, COPD, environmental allergies or pneumonia.  Chills 1week ago but resolved Quit tobacco use since 2011, no second smoke exposure.  Outpatient Medications Prior to Visit  Medication Sig   amLODipine (NORVASC) 5 MG tablet Take 5 mg by mouth daily.   Ascorbic Acid (VITAMIN C) 1000 MG tablet Take 1,000 mg by mouth daily.   meloxicam (MOBIC) 15 MG tablet Take 15 mg by mouth daily.   Multiple Vitamin (ONE-A-DAY MENS PO) Take by mouth.   rosuvastatin (CRESTOR) 10 MG tablet Take 10 mg by mouth daily.   tadalafil (CIALIS) 5 MG tablet Take 5 mg by mouth daily as needed for erectile dysfunction.   telmisartan (MICARDIS) 80 MG tablet Take 80 mg by mouth daily.   Zinc 50 MG TABS    aspirin 81 MG EC tablet 1 tablet (Patient not taking: Reported on 07/29/2023)   Cholecalciferol (VITAMIN D3) 250 MCG (10000 UT) TABS  (Patient not taking: Reported on 07/29/2023)   vitamin E 1000 UNIT capsule Take 1,000 Units by mouth daily. (Patient not taking: Reported on 07/29/2023)   No facility-administered medications prior to visit.   Reviewed past medical and social history.   Review of Systems  Constitutional:  Negative for chills and fever.  HENT:  Negative for postnasal drip, rhinorrhea and sore throat.   Respiratory:   Positive for cough. Negative for shortness of breath and wheezing.   Cardiovascular:  Negative for chest pain.  Musculoskeletal:  Negative for myalgias.  Allergic/Immunologic: Negative for environmental allergies.   Per HPI     Objective:    Physical Exam Vitals and nursing note reviewed.  Cardiovascular:     Rate and Rhythm: Normal rate and regular rhythm.     Pulses: Normal pulses.     Heart sounds: Normal heart sounds.  Pulmonary:     Effort: Pulmonary effort is normal.     Breath sounds: Normal breath sounds.  Musculoskeletal:     Right lower leg: No edema.     Left lower leg: No edema.  Neurological:     Mental Status: He is alert and oriented to person, place, and time.    BP 126/82 (BP Location: Left Arm, Patient Position: Sitting, Cuff Size: Large)   Pulse 74   Temp 98.3 F (36.8 C) (Temporal)   Wt 222 lb 6.4 oz (100.9 kg)   SpO2 99%   BMI 34.32 kg/m    No results found for any visits on 07/29/23.     Assessment & Plan:   Problem List Items Addressed This Visit   None Visit Diagnoses     Acute bronchitis, unspecified organism    -  Primary   Relevant Medications   benzonatate (TESSALON) 100 MG capsule   albuterol (VENTOLIN HFA)  108 (90 Base) MCG/ACT inhaler      Meds ordered this encounter  Medications   benzonatate (TESSALON) 100 MG capsule    Sig: Take 1-2 capsules (100-200 mg total) by mouth 3 (three) times daily as needed.    Dispense:  20 capsule    Refill:  0    Order Specific Question:   Supervising Provider    Answer:   Nadene Rubins ALFRED [[id number]]   albuterol (VENTOLIN HFA) 108 (90 Base) MCG/ACT inhaler    Sig: Inhale 1-2 puffs into the lungs every 6 (six) hours as needed for wheezing or shortness of breath.    Dispense:  8 g    Refill:  0    Order Specific Question:   Supervising Provider    Answer:   Mliss Sax [[id number]]   Return if symptoms worsen or fail to improve.    Alysia Penna, NP

## 2023-07-29 NOTE — Patient Instructions (Signed)
Encourage adequate oral hydration.  Ok to alternate benzonatate with mucinex Dm/Robitussin/delsym for cough without congestion  Use cool mist humidifier at bedtime to help with nasal congestion and cough.  Cold/cough medications may have tylenol or ibuprofen or guaifenesin or dextromethophan in them, so be careful not to take beyond the recommended dose for each of these medications.   "Common cold" symptoms are usually triggered by a virus.  The antibiotics are usually not necessary. On average, a" viral cold" illness may take 7-10 days to resolve. Please, make an appointment if you are not better or if you're worse.

## 2023-08-01 ENCOUNTER — Ambulatory Visit: Payer: BC Managed Care – PPO | Admitting: Family Medicine

## 2023-08-04 ENCOUNTER — Ambulatory Visit
Admission: RE | Admit: 2023-08-04 | Discharge: 2023-08-04 | Disposition: A | Discharge status: Home / Self Care | Payer: BC Managed Care – PPO | Source: Ambulatory Visit | Attending: Family Medicine | Admitting: Family Medicine

## 2023-08-04 DIAGNOSIS — Z87891 Personal history of nicotine dependence: Secondary | ICD-10-CM

## 2023-08-05 ENCOUNTER — Other Ambulatory Visit: Payer: BC Managed Care – PPO

## 2023-08-19 ENCOUNTER — Encounter: Payer: Self-pay | Admitting: Family Medicine

## 2023-08-19 DIAGNOSIS — I2510 Atherosclerotic heart disease of native coronary artery without angina pectoris: Secondary | ICD-10-CM | POA: Insufficient documentation

## 2023-08-19 DIAGNOSIS — J439 Emphysema, unspecified: Secondary | ICD-10-CM | POA: Insufficient documentation

## 2023-08-19 DIAGNOSIS — I700 Atherosclerosis of aorta: Secondary | ICD-10-CM | POA: Insufficient documentation

## 2023-10-19 ENCOUNTER — Ambulatory Visit: Payer: BC Managed Care – PPO | Admitting: Family Medicine

## 2023-10-19 ENCOUNTER — Encounter: Payer: Self-pay | Admitting: Family Medicine

## 2023-10-19 VITALS — BP 120/78 | HR 69 | Temp 98.4°F | Ht 60.75 in | Wt 230.2 lb

## 2023-10-19 DIAGNOSIS — H6123 Impacted cerumen, bilateral: Secondary | ICD-10-CM | POA: Diagnosis not present

## 2023-10-19 DIAGNOSIS — I10 Essential (primary) hypertension: Secondary | ICD-10-CM | POA: Diagnosis not present

## 2023-10-19 DIAGNOSIS — E782 Mixed hyperlipidemia: Secondary | ICD-10-CM

## 2023-10-19 DIAGNOSIS — J439 Emphysema, unspecified: Secondary | ICD-10-CM | POA: Diagnosis not present

## 2023-10-19 DIAGNOSIS — I2510 Atherosclerotic heart disease of native coronary artery without angina pectoris: Secondary | ICD-10-CM

## 2023-10-19 NOTE — Assessment & Plan Note (Signed)
Blood pressure is in good control. Continue amlodipine 5 mg daily and telmisartan 80 mg daily.

## 2023-10-19 NOTE — Progress Notes (Signed)
[facility] [address] [place] [address] Dept: [phone] Dept Fax: [phone]  Chronic Care Office Visit  Subjective:    Patient ID: Chad Brooks, male    DOB: [birth year], 57 y.o..   MRN: [id number]  Chief Complaint  Patient presents with   Hypertension    3 month f/u.       History of Present Illness:  Patient is in today for reassessment of chronic medical issues.  [name] has a history of hypertension. He is managed on amlodipine 5 mg daily and telmisartan 80 mg daily.   [name] has a history of hyperlipidemia. He is managed on rosuvastatin 10 mg daily.  [name] has noted some decreased hearing. He has had issues with impacted ear wax in the past and feels it may have built up again.  Past Medical History: Patient Active Problem List   Diagnosis Date Noted   Aortic atherosclerosis (HCC) 08/19/2023   Coronary atherosclerosis 08/19/2023   Emphysema of lung (HCC) 08/19/2023   Essential hypertension 07/20/2023   Arthritis of carpometacarpal (CMC) joint of both thumbs 07/20/2023   Hyperlipidemia 07/20/2023   Benign prostatic hyperplasia 07/20/2023   History of tobacco use 07/20/2023   Fuchs' corneal dystrophy 07/20/2023   History of rheumatic fever as a child 07/20/2023   Past Surgical History:  Procedure Laterality Date   ANTERIOR CRUCIATE LIGAMENT REPAIR     BUNIONECTOMY Right    HUMERUS FRACTURE SURGERY Left 12/09/2016   Left humerus repair   KNEE ARTHROSCOPY Left 1993   ROTATOR CUFF REPAIR Right    x 2   Family History  Problem Relation Age of Onset   COPD Mother    Heart disease Mother    Miscarriages / [place] Mother    Vision loss Mother    Hearing loss Father    Peripheral Artery Disease Father    Healthy Sister    Cancer Sister        Breast   Healthy Brother    Birth defects Brother    Heart disease Paternal Uncle    Diabetes Maternal Grandmother    Heart disease  Maternal Grandmother    Outpatient Medications Prior to Visit  Medication Sig Dispense Refill   amLODipine (NORVASC) 5 MG tablet Take 5 mg by mouth daily.     Ascorbic Acid (VITAMIN C) 1000 MG tablet Take 1,000 mg by mouth daily.     Cobalamin Combinations (B-12) [phone] MCG SUBL      meloxicam (MOBIC) 15 MG tablet Take 15 mg by mouth daily.     Misc Natural Products (OSTEO BI-FLEX ADV TRIPLE ST PO)      Multiple Vitamin (ONE-A-DAY MENS PO) Take by mouth.     rosuvastatin (CRESTOR) 10 MG tablet Take 10 mg by mouth daily.     tadalafil (CIALIS) 5 MG tablet Take 5 mg by mouth daily as needed for erectile dysfunction.     telmisartan (MICARDIS) 80 MG tablet Take 80 mg by mouth daily.     Zinc 50 MG TABS      albuterol (VENTOLIN HFA) 108 (90 Base) MCG/ACT inhaler Inhale 1-2 puffs into the lungs every 6 (six) hours as needed for wheezing or shortness of breath. 8 g 0   aspirin 81 MG EC tablet 1 tablet (Patient not taking: Reported on 07/29/2023)     benzonatate (TESSALON) 100 MG capsule Take 1-2 capsules (100-200 mg total) by mouth 3 (three) times daily as needed. 20  capsule 0   Cholecalciferol (VITAMIN D3) 250 MCG (10000 UT) TABS  (Patient not taking: Reported on 07/29/2023)     vitamin E 1000 UNIT capsule Take 1,000 Units by mouth daily. (Patient not taking: Reported on 07/29/2023)     No facility-administered medications prior to visit.   No Known Allergies Objective:   Today's Vitals   10/19/23 1546  BP: 120/78  Pulse: 69  Temp: 98.4 F (36.9 C)  TempSrc: Temporal  SpO2: 97%  Weight: 230 lb 3.2 oz (104.4 kg)  Height: 5' 0.75" (1.543 m)   Body mass index is 43.85 kg/m.   General: Well developed, well nourished. No acute distress. Psych: Alert and oriented. Normal mood and affect.  Health Maintenance Due  Topic Date Due   HIV Screening  Never done   Lab Results    Latest Ref Rng & Units 07/20/2023    4:09 PM 05/28/2011   10:38 PM 05/28/2011   10:20 PM  CBC  WBC 4.0 - 10.5  K/uL 7.1   10.3   Hemoglobin 13.0 - 17.0 g/dL 25.3  66.4  40.3   Hematocrit 39.0 - 52.0 % 41.6  38.0  36.7   Platelets 150.0 - 400.0 K/uL 174.0   215       Latest Ref Rng & Units 07/20/2023    4:09 PM 05/28/2011   10:38 PM  CMP  Glucose 70 - 99 mg/dL 99  474   BUN 6 - 23 mg/dL 23  31   Creatinine 2.59 - 1.50 mg/dL 5.63  8.75   Sodium 643 - 145 mEq/L 137  140   Potassium 3.5 - 5.1 mEq/L 4.3  3.8   Chloride 96 - 112 mEq/L 104  107   CO2 19 - 32 mEq/L 23    Calcium 8.4 - 10.5 mg/dL 9.2     Last lipids Lab Results  Component Value Date   CHOL 133 07/20/2023   HDL 49.00 07/20/2023   LDLCALC 66 07/20/2023   TRIG 93.0 07/20/2023   CHOLHDL 3 07/20/2023   Last thyroid functions Lab Results  Component Value Date   TSH 1.17 07/20/2023   Last vitamin D Lab Results  Component Value Date   VD25OH 45.80 07/20/2023   Last vitamin B12 and Folate Lab Results  Component Value Date   VITAMINB12 >1501 (H) 07/20/2023   Lab Results  Component Value Date   PSA 0.14 07/20/2023     Imaging: CT Chest Lung Cancer Screening (08/04/2023) IMPRESSION: 1. Lung-RADS 1, negative. Continue annual screening with low-dose chest CT without contrast in 12 months. 2. Aortic atherosclerosis (ICD10-I70.0), coronary artery atherosclerosis and emphysema (ICD10-J43.9).  PROCEDURE- Ear Wax Removal Indication: Impacted ear wax  PARQ reviewed with patient. Verbal consent obtained. Flushed ear with mixture of warm water and hydrogen peroxide. Lighted curette used to remove wax. Patient tolerated procedure well.  Assessment & Plan:   Problem List Items Addressed This Visit       Cardiovascular and Mediastinum   Coronary atherosclerosis   Coronary atherosclerosis noted on chest CT. [name] has hypertension, hyperlipidemia, significant past smoking history, and a family history of heart disease. He does experience occasional episodes of left arm pain with exertion that then resolves with rest. I  recommend we have him see cardiology to consider other evaluation for possible significant CAD.      Relevant Orders   Ambulatory referral to Cardiology   Essential hypertension - Primary   Blood pressure is in good control. Continue amlodipine 5  mg daily and telmisartan 80 mg daily.        Respiratory   Emphysema of lung (HCC)   At most, mildly symptomatic. Recommend ongoing abstinence form smoking and expectant management.        Other   Hyperlipidemia   Lipids are at goal. Continue rosuvastatin 10 mg daily.      Relevant Orders   Ambulatory referral to Cardiology   Other Visit Diagnoses       Bilateral impacted cerumen       Wax removed as noted above.       Return in about 3 months (around 01/17/2024).   Loyola Mast, MD

## 2023-10-19 NOTE — Assessment & Plan Note (Signed)
At most, mildly symptomatic. Recommend ongoing abstinence form smoking and expectant management.

## 2023-10-19 NOTE — Assessment & Plan Note (Signed)
 Lipids are at goal. Continue rosuvastatin 10 mg daily.

## 2023-10-19 NOTE — Assessment & Plan Note (Signed)
Coronary atherosclerosis noted on chest CT. Chad Brooks has hypertension, hyperlipidemia, significant past smoking history, and a family history of heart disease. He does experience occasional episodes of left arm pain with exertion that then resolves with rest. I recommend we have him see cardiology to consider other evaluation for possible significant CAD.

## 2023-11-17 ENCOUNTER — Other Ambulatory Visit: Payer: Self-pay

## 2023-11-17 MED ORDER — TELMISARTAN 80 MG PO TABS: 80.0000 mg | ORAL_TABLET | Freq: Every day | ORAL | 3 refills | Status: DC

## 2023-11-17 MED ORDER — AMLODIPINE BESYLATE 5 MG PO TABS: 5.0000 mg | ORAL_TABLET | Freq: Every day | ORAL | 3 refills | Status: DC

## 2023-11-17 MED ORDER — ROSUVASTATIN CALCIUM 10 MG PO TABS: 10.0000 mg | ORAL_TABLET | Freq: Every day | ORAL | 3 refills | Status: DC

## 2023-11-17 NOTE — Telephone Encounter (Signed)
Refill request for  Telmisartan 80 mg LR HX provider  Amlodipine 5 gm LR HX provider  Rosuvastatin 10 mg LR  HX provider LOV  07/20/23 FOV  01/17/24  Please review and advise.  Thanks.  Dm/cma

## 2023-12-26 ENCOUNTER — Encounter: Payer: Self-pay | Admitting: Cardiology

## 2023-12-26 ENCOUNTER — Ambulatory Visit: Payer: 59 | Attending: Cardiology | Admitting: Cardiology

## 2023-12-26 VITALS — BP 118/76 | HR 59 | Resp 16 | Ht 67.0 in | Wt 229.0 lb

## 2023-12-26 DIAGNOSIS — I700 Atherosclerosis of aorta: Secondary | ICD-10-CM

## 2023-12-26 DIAGNOSIS — E7800 Pure hypercholesterolemia, unspecified: Secondary | ICD-10-CM | POA: Diagnosis not present

## 2023-12-26 DIAGNOSIS — I10 Essential (primary) hypertension: Secondary | ICD-10-CM | POA: Diagnosis not present

## 2023-12-26 DIAGNOSIS — I2510 Atherosclerotic heart disease of native coronary artery without angina pectoris: Secondary | ICD-10-CM

## 2023-12-26 DIAGNOSIS — Z8249 Family history of ischemic heart disease and other diseases of the circulatory system: Secondary | ICD-10-CM

## 2023-12-26 MED ORDER — ASPIRIN 81 MG PO CHEW: 81.0000 mg | CHEWABLE_TABLET | Freq: Every day | ORAL | Status: AC

## 2023-12-26 NOTE — Progress Notes (Signed)
 Cardiology Office Note:  .   Date:  12/26/2023  ID:  Chad Brooks, DOB [birth year], MRN [id number] PCP: Loyola Mast, MD  [facility] Providers Cardiologist:  Yates Decamp, MD   History of Present Illness: Chad Brooks is a 58 y.o. Caucasian male patient with remote history of tobacco use disorder, quit sometime in 2010, hypertension, hypercholesterolemia being managed by telmisartan 80 mg and amlodipine 5 mg daily along with rosuvastatin 10 mg daily referred to me for evaluation of and risk stratification of possible CAD as he was noted to have coronary atherosclerosis on the CT scan of the chest.  History of premature coronary disease in his mother who had MI at the age of 41 Y.   Discussed the use of AI scribe software for clinical note transcription with the patient, who gave verbal consent to proceed.  History of Present Illness   The patient, a former smoker with a family history of heart disease, presents for a general check-up. He quit smoking around 2010 and has recently embarked on a weight loss program with his wife. Despite being a International aid/development worker, his job involves minimal physical activity, mostly consisting of walking and desk work. He admits to leading a largely sedentary lifestyle, with his most strenuous activity being mowing the lawn in the summer.  The patient's mother had a heart attack at 44 and underwent multiple bypass surgeries. His father had lung problems, likely due to smoking, and had procedures to clean plaque from his heart. The patient's sister has recently been diagnosed with AFib, which his father also had.  The patient has not engaged in significant physical activity for several years, since breaking his arm. He expresses a desire to live a long and healthy life and has started making lifestyle changes, including dietary modifications for weight loss. He is two weeks into this new regimen and is trying to improve his health.      Labs   Lab Results   Component Value Date   CHOL 133 07/20/2023   HDL 49.00 07/20/2023   LDLCALC 66 07/20/2023   TRIG 93.0 07/20/2023   CHOLHDL 3 07/20/2023   Lab Results  Component Value Date   NA 137 07/20/2023   K 4.3 07/20/2023   CO2 23 07/20/2023   GLUCOSE 99 07/20/2023   BUN 23 07/20/2023   CREATININE 1.23 07/20/2023   CALCIUM 9.2 07/20/2023   GFR 65.04 07/20/2023      Latest Ref Rng & Units 07/20/2023    4:09 PM 05/28/2011   10:38 PM  BMP  Glucose 70 - 99 mg/dL 99  782   BUN 6 - 23 mg/dL 23  31   Creatinine 9.56 - 1.50 mg/dL 2.13  0.86   Sodium 578 - 145 mEq/L 137  140   Potassium 3.5 - 5.1 mEq/L 4.3  3.8   Chloride 96 - 112 mEq/L 104  107   CO2 19 - 32 mEq/L 23    Calcium 8.4 - 10.5 mg/dL 9.2        Latest Ref Rng & Units 07/20/2023    4:09 PM 05/28/2011   10:38 PM 05/28/2011   10:20 PM  CBC  WBC 4.0 - 10.5 K/uL 7.1   10.3   Hemoglobin 13.0 - 17.0 g/dL 46.9  62.9  52.8   Hematocrit 39.0 - 52.0 % 41.6  38.0  36.7   Platelets 150.0 - 400.0 K/uL 174.0   215    Lab Results  Component Value Date   TSH 1.17 07/20/2023    Review of Systems  Cardiovascular:  Negative for chest pain, dyspnea on exertion and leg swelling.   Physical Exam:   VS:  BP 118/76 (BP Location: Left Arm, Patient Position: Sitting, Cuff Size: Large)   Pulse (!) 59   Resp 16   Ht 5\' 7"  (1.702 m)   Wt 229 lb (103.9 kg)   SpO2 98%   BMI 35.87 kg/m    Wt Readings from Last 3 Encounters:  12/26/23 229 lb (103.9 kg)  10/19/23 230 lb 3.2 oz (104.4 kg)  07/29/23 222 lb 6.4 oz (100.9 kg)    Physical Exam Constitutional:      Appearance: He is obese.  Neck:     Vascular: No carotid bruit or JVD.  Cardiovascular:     Rate and Rhythm: Normal rate and regular rhythm.     Pulses: Intact distal pulses.     Heart sounds: Normal heart sounds. No murmur heard.    No gallop.  Pulmonary:     Effort: Pulmonary effort is normal.     Breath sounds: Normal breath sounds.  Abdominal:     General: Bowel sounds are  normal.     Palpations: Abdomen is soft.  Musculoskeletal:     Right lower leg: No edema.     Left lower leg: No edema.    Studies Reviewed: Marland Kitchen    Bilateral lower extremity segmental study 08/23/2017: Normal lower extremity arterial examination with normal ABI bilaterally and normal TBI bilaterally.  CT Chest Lung Cancer Screening (08/04/2023) 1. Lung-RADS 1, negative. Continue annual screening with low-dose chest CT without contrast in 12 months. 2. Aortic atherosclerosis (ICD10-I70.0), coronary artery atherosclerosis and emphysema (ICD10-J43.9). Tortuous thoracic aorta. Borderline cardiomegaly. Lad and right coronary artery calcification.  EKG:    EKG Interpretation Date/Time:  Monday December 26 2023 15:47:01 EST Ventricular Rate:  59 PR Interval:  180 QRS Duration:  88 QT Interval:  384 QTC Calculation: 380 R Axis:   -23  Text Interpretation: EKG 12/26/2023: Normal sinus rhythm at the rate of 59 bpm, poor R wave progression, probably normal variant.  No evidence of ischemia.  Normal EKG.  No prior EKG to compare. Confirmed by Delrae Rend [phone]) on 12/26/2023 4:16:25 PM    Medications and allergies    No Known Allergies   Current Outpatient Medications:    amLODipine (NORVASC) 5 MG tablet, Take 1 tablet (5 mg total) by mouth daily., Disp: 90 tablet, Rfl: 3   Ascorbic Acid (VITAMIN C) 1000 MG tablet, Take 1,000 mg by mouth daily., Disp: , Rfl:    aspirin (ASPIRIN CHILDRENS) 81 MG chewable tablet, Chew 1 tablet (81 mg total) by mouth daily., Disp: , Rfl:    Cobalamin Combinations (B-12) [phone] MCG SUBL, , Disp: , Rfl:    meloxicam (MOBIC) 15 MG tablet, Take 15 mg by mouth daily., Disp: , Rfl:    Misc Natural Products (OSTEO BI-FLEX ADV TRIPLE ST PO), , Disp: , Rfl:    Multiple Vitamin (ONE-A-DAY MENS PO), Take by mouth., Disp: , Rfl:    rosuvastatin (CRESTOR) 10 MG tablet, Take 1 tablet (10 mg total) by mouth daily., Disp: 90 tablet, Rfl: 3   tadalafil (CIALIS) 5 MG  tablet, Take 5 mg by mouth daily as needed for erectile dysfunction., Disp: , Rfl:    telmisartan (MICARDIS) 80 MG tablet, Take 1 tablet (80 mg total) by mouth daily., Disp: 90 tablet, Rfl: 3   Zinc 50  MG TABS, , Disp: , Rfl:    ASSESSMENT AND PLAN: .      ICD-10-CM   1. Coronary artery calcification seen on CAT scan  I25.10 EKG 12-Lead    aspirin (ASPIRIN CHILDRENS) 81 MG chewable tablet    ECHOCARDIOGRAM COMPLETE    EXERCISE TOLERANCE TEST (ETT)    Cardiac Stress Test: Informed Consent Details: Physician/Practitioner Attestation; Transcribe to consent form and obtain patient signature    2. Primary hypertension  I10 ECHOCARDIOGRAM COMPLETE    EXERCISE TOLERANCE TEST (ETT)    3. Pure hypercholesterolemia  E78.00 ECHOCARDIOGRAM COMPLETE    EXERCISE TOLERANCE TEST (ETT)    4. Aortic atherosclerosis (HCC)  I70.0 aspirin (ASPIRIN CHILDRENS) 81 MG chewable tablet    ECHOCARDIOGRAM COMPLETE    EXERCISE TOLERANCE TEST (ETT)    5. Family history of premature CAD: Mother with MI at 57Y  Z85.49 ECHOCARDIOGRAM COMPLETE    EXERCISE TOLERANCE TEST (ETT)     Coronary calcification noted on the CT scan of the chest A 58 year old individual with significant plaque burden on CT, prior tobacco use disorder, hypertension, hyperlipidemia, and a family history of premature CAD (mother had MI at age 63). Patient instructed to start ASA  81mg  q daily for prophylaxis. Cholesterol medication will be continued indefinitely due to calcium buildup. Order a treadmill stress test and an echocardiogram because of borderline cardiomegaly noted on the CT scan of the chest.  Hyperlipidemia Rosuvastatin 10 mg n effectively manages hyperlipidemia. Continuing lipid-lowering medication is necessary to prevent further plaque buildup and reduce cardiovascular risk.  External labs from PCP reviewed, LDL is at goal at <70.    Hypertension Hypertension is well-controlled with telmisartan 80 mg and amlodipine 5 mg.      Obesity A sedentary lifestyle and weight gain over the past 7-8 years have led to obesity, as indicated by the physical exam. Participation in a weight loss program is ongoing. The benefits of daily physical activity and the importance of consistency and motivation were discussed. Encourage daily physical activity, such as walking for 15-20 minutes, and continue participation in the weight loss program. Provide motivational support for lifestyle changes.  Emphysema Lung imaging shows evidence of emphysema. Smoking cessation since 2010-2011 is noted, and the importance of continued smoking cessation to prevent further lung damage was discussed. Continue smoking cessation and monitor lung function as needed.  General Health Maintenance Efforts to improve lifestyle, including diet and exercise, are ongoing. There is no alcohol consumption. Despite a family history of diabetes, there is no personal history. The importance of regular physical activity and monitoring for signs of diabetes, given the family history, was discussed. Encourage continued healthy lifestyle changes and recommend regular physical activity. Monitor for signs of diabetes given the family history.  Follow-up Message on MyChart with test results. No immediate follow-up is necessary if tests are normal.  He is on best medical therapy with regard to primary prevention and he is also making lifestyle changes which I have encouraged him to continue to enforce. I will personally perform the test and if I find abnormalities,  will perform further evaluation. Otherwise unless new on ongoing symptoms(patient advised to contact us), preventive  therapy is recommended. I will then see the patient on a PRN basis.   Signed,  Yates Decamp, MD, [facility] 12/26/2023, 6:29 PM [facility] [address] [place], [place] [address] Phone: [phone]. Fax:  [phone]

## 2023-12-26 NOTE — Patient Instructions (Addendum)
 Medication Instructions:  Your physician recommends that you continue on your current medications as directed. Please refer to the Current Medication list given to you today.  *If you need a refill on your cardiac medications before your next appointment, please call your pharmacy*   Lab Work: none If you have labs (blood work) drawn today and your tests are completely normal, you will receive your results only by: MyChart Message (if you have MyChart) OR A paper copy in the mail If you have any lab test that is abnormal or we need to change your treatment, we will call you to review the results.   Testing/Procedures: Your physician has requested that you have an echocardiogram. Echocardiography is a painless test that uses sound waves to create images of your heart. It provides your doctor with information about the size and shape of your heart and how well your heart's chambers and valves are working. This procedure takes approximately one hour. There are no restrictions for this procedure. Please do NOT wear cologne, perfume, aftershave, or lotions (deodorant is allowed). Please arrive 15 minutes prior to your appointment time.  Please note: We ask at that you not bring children with you during ultrasound (echo/ vascular) testing. Due to room size and safety concerns, children are not allowed in the ultrasound rooms during exams. Our front office staff cannot provide observation of children in our lobby area while testing is being conducted. An adult accompanying a patient to their appointment will only be allowed in the ultrasound room at the discretion of the ultrasound technician under special circumstances. We apologize for any inconvenience.  Your physician has requested that you have an exercise tolerance test. For further information please visit [email or web address] Please also follow instruction sheet, as given.    Follow-Up: At [facility], you and your health needs  are our priority.  As part of our continuing mission to provide you with exceptional heart care, we have created designated Provider Care Teams.  These Care Teams include your primary Cardiologist (physician) and Advanced Practice Providers (APPs -  Physician Assistants and Nurse Practitioners) who all work together to provide you with the care you need, when you need it.  We recommend signing up for the patient portal called "MyChart".  Sign up information is provided on this After Visit Summary.  MyChart is used to connect with patients for Virtual Visits (Telemedicine).  Patients are able to view lab/test results, encounter notes, upcoming appointments, etc.  Non-urgent messages can be sent to your provider as well.   To learn more about what you can do with MyChart, go to [email or web address].    Your next appointment:   As needed  Provider:   Yates Decamp, MD     Other Instructions   You are scheduled for an Exercise Stress Test on ____ at _____ am/pm.   Please arrive 15 minutes prior to your appointment time to allow for registration and insurance purposes.  The test will take approximately 45 minutes to complete.  How to prepare for your Exercise Stress Test: - Do bring a list of your current medications with you. If you do not take any of the medications listed below, you may take your medications as normal the day of the test. May take all your medications the day of the test - DO wear comfortable clothes (no dresses or overalls) and walking shoes, tennis shoes preferred (no heels or open toed shoes allowed). -

## 2024-01-17 ENCOUNTER — Ambulatory Visit: Payer: BC Managed Care – PPO | Admitting: Family Medicine

## 2024-01-17 ENCOUNTER — Encounter: Payer: Self-pay | Admitting: Family Medicine

## 2024-01-17 VITALS — BP 120/68 | HR 64 | Temp 98.5°F | Ht 67.0 in | Wt 229.4 lb

## 2024-01-17 DIAGNOSIS — S43401A Unspecified sprain of right shoulder joint, initial encounter: Secondary | ICD-10-CM

## 2024-01-17 DIAGNOSIS — I10 Essential (primary) hypertension: Secondary | ICD-10-CM | POA: Diagnosis not present

## 2024-01-17 MED ORDER — TRAMADOL HCL 50 MG PO TABS: 50.0000 mg | ORAL_TABLET | Freq: Three times a day (TID) | ORAL | 0 refills | Status: AC | PRN

## 2024-01-17 NOTE — Assessment & Plan Note (Signed)
 Blood pressure is in good control. Continue amlodipine 5 mg daily and telmisartan 80 mg daily.

## 2024-01-17 NOTE — Progress Notes (Signed)
 [facility] [address] [place] [address] Dept: [phone] Dept Fax: [phone]  Chronic Care Office Visit  Subjective:    Patient ID: Chad Brooks, male    DOB: 02/26/1966, 58 y.o..   MRN: [id number]  Chief Complaint  Patient presents with   Hypertension    3 month f/u HTN.   C/o having RT shoulder pain x 3 days.   Taken tylenol Arthritis.    History of Present Illness:  Patient is in today for reassessment of chronic medical issues.  Mr. Bussiere has a history of hypertension. He is managed on amlodipine 5 mg daily and telmisartan 80 mg daily.   Mr. Patti has a history of hyperlipidemia. He is managed on rosuvastatin 10 mg daily.  Mr. Crean notes he accidentally pulled his shoulder 4 days ago. He was getting onto his mower. The deck was wet, so his foot slipped. He caught himself with his right arm. He has had pain in the right shoulder since that time. The first few days, he had more limited range of motion. This is improving with time. He has had past rotator cuff surgery on that shoulder. He is on meloxicam daily. He has been taking some Tylenol PM additionally, but still having difficulty with sleep.  Past Medical History: Patient Active Problem List   Diagnosis Date Noted   Aortic atherosclerosis (HCC) 08/19/2023   Coronary atherosclerosis 08/19/2023   Emphysema of lung (HCC) 08/19/2023   Essential hypertension 07/20/2023   Arthritis of carpometacarpal (CMC) joint of both thumbs 07/20/2023   Hyperlipidemia 07/20/2023   Benign prostatic hyperplasia 07/20/2023   History of tobacco use 07/20/2023   Fuchs' corneal dystrophy 07/20/2023   History of rheumatic fever as a child 07/20/2023   Past Surgical History:  Procedure Laterality Date   ANTERIOR CRUCIATE LIGAMENT REPAIR     BUNIONECTOMY Right    HUMERUS FRACTURE SURGERY Left 12/09/2016   Left humerus repair   KNEE ARTHROSCOPY Left 1993   ROTATOR CUFF  REPAIR Right    x 2   Family History  Problem Relation Age of Onset   COPD Mother    Heart disease Mother    Miscarriages / [place] Mother    Vision loss Mother    Hearing loss Father    Peripheral Artery Disease Father    Healthy Sister    Cancer Sister        Breast   Healthy Brother    Birth defects Brother    Heart disease Paternal Uncle    Diabetes Maternal Grandmother    Heart disease Maternal Grandmother    Outpatient Medications Prior to Visit  Medication Sig Dispense Refill   amLODipine (NORVASC) 5 MG tablet Take 1 tablet (5 mg total) by mouth daily. 90 tablet 3   Ascorbic Acid (VITAMIN C) 1000 MG tablet Take 1,000 mg by mouth daily.     aspirin (ASPIRIN CHILDRENS) 81 MG chewable tablet Chew 1 tablet (81 mg total) by mouth daily.     Cobalamin Combinations (B-12) [phone] MCG SUBL      meloxicam (MOBIC) 15 MG tablet Take 15 mg by mouth daily.     Misc Natural Products (OSTEO BI-FLEX ADV TRIPLE ST PO)      Multiple Vitamin (ONE-A-DAY MENS PO) Take by mouth.     neomycin-polymyxin b-dexamethasone (MAXITROL) 3.5-10000-0.1 OINT      rosuvastatin (CRESTOR) 10 MG tablet Take 1 tablet (10 mg total) by mouth daily. 90 tablet 3  tadalafil (CIALIS) 5 MG tablet Take 5 mg by mouth daily as needed for erectile dysfunction.     telmisartan (MICARDIS) 80 MG tablet Take 1 tablet (80 mg total) by mouth daily. 90 tablet 3   Zinc 50 MG TABS      Cobalamin Combinations (B-12) [phone] MCG SUBL      No facility-administered medications prior to visit.   No Known Allergies Objective:   Today's Vitals   01/17/24 1559  BP: 120/68  Pulse: 64  Temp: 98.5 F (36.9 C)  TempSrc: Temporal  SpO2: 98%  Weight: 229 lb 6.4 oz (104.1 kg)  Height: 5\' 7"  (1.702 m)   Body mass index is 35.93 kg/m.   General: Well developed, well nourished. No acute distress. Extremities: Full ROM. No joint swelling. Mild tenderness over the anterolateral aspect of the right shoulder.  Psych: Alert  and oriented. Normal mood and affect.  Health Maintenance Due  Topic Date Due   Pneumococcal Vaccine 58-60 Years old (1 of 2 - PCV) Never done   HIV Screening  Never done     Assessment & Plan:   Problem List Items Addressed This Visit       Cardiovascular and Mediastinum   Essential hypertension   Blood pressure is in good control. Continue amlodipine 5 mg daily and telmisartan 80 mg daily.      Other Visit Diagnoses       Sprain of right shoulder, unspecified shoulder sprain type, initial encounter    -  Primary   The shoudler is improving at this point. I will have him continue relative rest, continue meloxicam. I will add a short course of tramadol for pain relief.   Relevant Medications   traMADol (ULTRAM) 50 MG tablet       Return in about 4 months (around 05/18/2024) for Reassessment.   Loyola Mast, MD

## 2024-01-19 ENCOUNTER — Ambulatory Visit (HOSPITAL_BASED_OUTPATIENT_CLINIC_OR_DEPARTMENT_OTHER): Payer: 59

## 2024-01-19 ENCOUNTER — Ambulatory Visit: Payer: 59 | Attending: Cardiology

## 2024-01-19 DIAGNOSIS — E7800 Pure hypercholesterolemia, unspecified: Secondary | ICD-10-CM | POA: Insufficient documentation

## 2024-01-19 DIAGNOSIS — I2510 Atherosclerotic heart disease of native coronary artery without angina pectoris: Secondary | ICD-10-CM

## 2024-01-19 DIAGNOSIS — I10 Essential (primary) hypertension: Secondary | ICD-10-CM | POA: Diagnosis present

## 2024-01-19 DIAGNOSIS — Z8249 Family history of ischemic heart disease and other diseases of the circulatory system: Secondary | ICD-10-CM

## 2024-01-19 DIAGNOSIS — I700 Atherosclerosis of aorta: Secondary | ICD-10-CM | POA: Diagnosis present

## 2024-01-19 LAB — EXERCISE TOLERANCE TEST
Angina Index: 0
Duke Treadmill Score: 8
Estimated workload: 10.1
Exercise duration (min): 8 min
Exercise duration (sec): 0 s
MPHR: 162 {beats}/min
Peak HR: 157 {beats}/min
Percent HR: 96 %
RPE: 17
Rest HR: 85 {beats}/min
ST Depression (mm): 0 mm

## 2024-01-19 LAB — ECHOCARDIOGRAM COMPLETE
Area-P 1/2: 4.54 cm2
P 1/2 time: 385 ms
S' Lateral: 3.2 cm

## 2024-01-20 ENCOUNTER — Encounter: Payer: Self-pay | Admitting: Cardiology

## 2024-01-20 NOTE — Progress Notes (Signed)
 Excellent effort and all normal. I did review your echo and do not think you need further evaluation. Echo is essentially normal in my opinion and will discuss more on your visit

## 2024-02-05 ENCOUNTER — Encounter: Payer: Self-pay | Admitting: Family Medicine

## 2024-02-05 DIAGNOSIS — S43401A Unspecified sprain of right shoulder joint, initial encounter: Secondary | ICD-10-CM

## 2024-02-05 DIAGNOSIS — N401 Enlarged prostate with lower urinary tract symptoms: Secondary | ICD-10-CM

## 2024-02-06 MED ORDER — TADALAFIL 5 MG PO TABS: 5.0000 mg | ORAL_TABLET | Freq: Every day | ORAL | 11 refills | Status: DC | PRN

## 2024-02-06 MED ORDER — MELOXICAM 15 MG PO TABS: 15.0000 mg | ORAL_TABLET | Freq: Every day | ORAL | 2 refills | Status: DC

## 2024-02-06 MED ORDER — TRAMADOL HCL 50 MG PO TABS: 50.0000 mg | ORAL_TABLET | Freq: Three times a day (TID) | ORAL | 0 refills | Status: AC | PRN

## 2024-02-06 NOTE — Telephone Encounter (Signed)
 Patient is wanting refills for the Tadafil and Tramadol and meloxicam.   Please review and advise.  Thanks. Dm/cma

## 2024-02-07 ENCOUNTER — Encounter (INDEPENDENT_AMBULATORY_CARE_PROVIDER_SITE_OTHER): Payer: Self-pay | Admitting: Physician Assistant

## 2024-02-07 ENCOUNTER — Encounter (INDEPENDENT_AMBULATORY_CARE_PROVIDER_SITE_OTHER): Payer: Self-pay

## 2024-03-13 ENCOUNTER — Ambulatory Visit (INDEPENDENT_AMBULATORY_CARE_PROVIDER_SITE_OTHER): Admitting: Physician Assistant

## 2024-03-13 ENCOUNTER — Encounter (INDEPENDENT_AMBULATORY_CARE_PROVIDER_SITE_OTHER): Payer: Self-pay | Admitting: Physician Assistant

## 2024-03-13 VITALS — BP 122/74 | HR 72 | Temp 98.1°F | Ht 67.0 in | Wt 220.0 lb

## 2024-03-13 DIAGNOSIS — J439 Emphysema, unspecified: Secondary | ICD-10-CM | POA: Diagnosis not present

## 2024-03-13 DIAGNOSIS — G4730 Sleep apnea, unspecified: Secondary | ICD-10-CM

## 2024-03-13 DIAGNOSIS — I10 Essential (primary) hypertension: Secondary | ICD-10-CM | POA: Diagnosis not present

## 2024-03-13 DIAGNOSIS — I2510 Atherosclerotic heart disease of native coronary artery without angina pectoris: Secondary | ICD-10-CM | POA: Diagnosis not present

## 2024-03-13 DIAGNOSIS — E6609 Other obesity due to excess calories: Secondary | ICD-10-CM

## 2024-03-13 DIAGNOSIS — Z6834 Body mass index (BMI) 34.0-34.9, adult: Secondary | ICD-10-CM

## 2024-03-13 DIAGNOSIS — E782 Mixed hyperlipidemia: Secondary | ICD-10-CM

## 2024-03-13 DIAGNOSIS — Z0289 Encounter for other administrative examinations: Secondary | ICD-10-CM

## 2024-03-13 DIAGNOSIS — E66811 Obesity, class 1: Secondary | ICD-10-CM

## 2024-03-13 DIAGNOSIS — I700 Atherosclerosis of aorta: Secondary | ICD-10-CM

## 2024-03-13 DIAGNOSIS — Z87891 Personal history of nicotine dependence: Secondary | ICD-10-CM

## 2024-03-13 DIAGNOSIS — M1990 Unspecified osteoarthritis, unspecified site: Secondary | ICD-10-CM

## 2024-03-13 DIAGNOSIS — Z8679 Personal history of other diseases of the circulatory system: Secondary | ICD-10-CM

## 2024-03-13 NOTE — Progress Notes (Signed)
 Office: [phone]  /  Fax: [phone]   Initial Visit  Chad Brooks was seen in clinic today to evaluate for obesity. He is interested in losing weight to improve overall health and reduce the risk of weight related complications. He presents today to review program treatment options, initial physical assessment, and evaluation.     Discussed the use of AI scribe software for clinical note transcription with the patient, who gave verbal consent to proceed.  History of Present Illness Chad Brooks is a 57 year old male who presents for initial evaluation for obesity treatment.  He has struggled with obesity for many years, experiencing weight fluctuations throughout his life. His weight was 150 pounds in 2017, with a peak of 265 pounds. He has tried various diets, including a restrictive 500-calorie diet called Omnutrition, which resulted in significant weight loss but also led to unhealthy eating patterns and muscle loss.  His weight has negatively impacted his self-esteem, as he feels that 'nothing fits,' and has contributed to fatigue, poor endurance, and orthopedic issues, including arthritis in his knees, back, and shoulders. He has undergone multiple surgeries, including two rotator cuff surgeries and two knee surgeries. He experiences irregular eating patterns, often going hours without eating and then overeating, and acknowledges that his weight has affected his mood, causing anxiety and depression.  His past medical history includes hypertension, atherosclerosis of the coronary arteries, emphysema or COPD, hyperlipidemia, and a history of rheumatic fever as a child. He also has aortic atherosclerosis. No history of diabetes, prediabetes, gastroesophageal reflux disease, overactive bladder, or diagnosed heart arrhythmias. He has been tested for sleep apnea but was unable to tolerate CPAP therapy.  He has a family history of heart failure and obesity, describing his family as 'a fat  family.' He has a history of tobacco use and currently experiences high levels of stress due to his job. He does not engage in regular exercise, citing orthopedic issues as a limitation, and is not following any specific diet plan. He has not used any medications specifically for weight loss, aside from the diet pills associated with the Omnutrition program.     He was referred by: Friend or Family wife is a patient now and Dr. Currie DouseCherene Core  When asked what else they would like to accomplish? He states: Adopt healthier eating patterns, Improve energy levels and physical activity, Improve existing medical conditions, Reduce number of medications, Reduce risk for a surgery, Improve quality of life, Improve appearance, Improve self-confidence, and Lose a target amount of weight : Goal weight of 175 lbs lbs in 9-12 months.  When asked how has your weight affected you? He states: Has affected self-esteem, Contributed to medical problems, Contributed to orthopedic problems or mobility issues, Having fatigue, Having poor endurance, Problems with eating patterns, and Has affected mood   Weight history: He has struggled with obesity for many years, experiencing weight fluctuations throughout his life. His weight was 150 pounds in 2017, with a peak of 265 pounds. He has tried various diets, including a restrictive 500-calorie diet called Omnutrition, which resulted in significant weight loss but also led to unhealthy eating patterns and muscle loss.  Some associated conditions: Hypertension, Arthritis:Knees,back and shoulders, feet, Hyperlipidemia, ASCVD, and Lung disease  Contributing factors: Family history of obesity, Disruption of circadian rhythm / sleep disordered breathing, Consumption of processed foods, Moderate to high levels of stress, Reduced physical activity, Eating patterns, Mental health problems, Strong orexigenic signaling and inadequate inhibitory control , and Slow metabolism  for  age  Weight promoting medications identified: None  Current nutrition plan: None  Current level of physical activity: None and Limited due to chronic pain or orthopedic problems  Current or previous pharmacotherapy: None  Response to medication: Never tried medications   Past medical history includes:   Past Medical History:  Diagnosis Date   Allergy    Anxiety    Arthritis    GERD (gastroesophageal reflux disease)    Hypertension      Objective:   BP 122/74   Pulse 72   Temp 98.1 F (36.7 C)   Ht 5\' 7"  (1.702 m)   Wt 220 lb (99.8 kg)   SpO2 98%   BMI 34.46 kg/m  He was weighed on the bioimpedance scale: Body mass index is 34.46 kg/m.  Peak Weight:265 lbs , Body Fat%:33.2%, Visceral Fat Rating:19, Weight trend over the last 12 months: Increasing  General:  Alert, oriented and cooperative. Patient is in no acute distress.  Respiratory: Normal respiratory effort, no problems with respiration noted   Gait: able to ambulate independently  Mental Status: Normal mood and affect. Normal behavior. Normal judgment and thought content.   DIAGNOSTIC DATA REVIEWED:  BMET    Component Value Date/Time   NA 137 07/20/2023 1609   K 4.3 07/20/2023 1609   CL 104 07/20/2023 1609   CO2 23 07/20/2023 1609   GLUCOSE 99 07/20/2023 1609   BUN 23 07/20/2023 1609   CREATININE 1.23 07/20/2023 1609   CALCIUM  9.2 07/20/2023 1609   No results found for: "HGBA1C" No results found for: "INSULIN" CBC    Component Value Date/Time   WBC 7.1 07/20/2023 1609   RBC 4.52 07/20/2023 1609   HGB 13.8 07/20/2023 1609   HCT 41.6 07/20/2023 1609   PLT 174.0 07/20/2023 1609   MCV 92.0 07/20/2023 1609   MCH 30.0 05/28/2011 2220   MCHC 33.2 07/20/2023 1609   RDW 13.8 07/20/2023 1609   Iron/TIBC/Ferritin/ %Sat No results found for: "IRON", "TIBC", "FERRITIN", "IRONPCTSAT" Lipid Panel     Component Value Date/Time   CHOL 133 07/20/2023 1609   TRIG 93.0 07/20/2023 1609   HDL 49.00  07/20/2023 1609   CHOLHDL 3 07/20/2023 1609   VLDL 18.6 07/20/2023 1609   LDLCALC 66 07/20/2023 1609   Hepatic Function Panel  No results found for: "PROT", "ALBUMIN", "AST", "ALT", "ALKPHOS", "BILITOT", "BILIDIR", "IBILI"    Component Value Date/Time   TSH 1.17 07/20/2023 1609     Assessment and Plan:   Essential hypertension  Atherosclerosis of coronary artery of native heart, unspecified vessel or lesion type, unspecified whether angina present  Pulmonary emphysema, unspecified emphysema type (HCC)  Mixed hyperlipidemia  History of tobacco use  History of rheumatic fever as a child  Aortic atherosclerosis (HCC)  Class 1 obesity due to excess calories with serious comorbidity and body mass index (BMI) of 34.0 to 34.9 in adult   Current BMI 34.6  Assessment and Plan Assessment & Plan Obesity Chronic obesity with weight fluctuations. Current weight goal is 175 lbs, previously 150 lbs in 2017. Unhealthy dieting practices include extreme calorie restriction and diet pills. Current issues: orthopedic problems, fatigue, poor endurance, mood disturbances. High visceral adipose rating of 19, target is 12 or less. Overall adiposity at 33.2%. Weight loss is crucial to reduce risks of myocardial infarction, stroke, type 2 diabetes, prostate and colorectal cancer. - Conduct standard labs and metabolism test after 8-hour fasting. - Advise no exercise 24 hours prior to metabolism test. -  Develop sustainable eating plan based on metabolism test results. - Schedule follow-up visits every 2-3 weeks for the first 3 months, then monthly. - Encourage maintenance of muscle mass and healthy weight loss. - Provide strategies for managing eating patterns and stress. - Complete new patient packet prior to first visit  Hypertension Hypertension potentially exacerbated by obesity and sleep apnea.  Sleep apnea Sleep apnea with previous CPAP intolerance. May contribute to hypertension and  cardiovascular risk.  Atherosclerosis of coronary arteries Coronary artery atherosclerosis with risk of myocardial infarction and stroke. Weight loss and lifestyle changes are crucial to reduce cardiovascular risk.  Aortic atherosclerosis Aortic atherosclerosis with plaque formation. Weight loss and lifestyle changes are important to reduce cardiovascular risk.  Hyperlipidemia Elevated cholesterol levels likely contributing to cardiovascular risk.  Arthritis Arthritis in knees, back, shoulders, and feet, limiting physical activity. History of multiple orthopedic surgeries.        Obesity Treatment / Action Plan:  Patient will work on garnering support from family and friends to begin weight loss journey. Will work on eliminating or reducing the presence of highly palatable, calorie dense foods in the home. Will complete provided nutritional and psychosocial assessment questionnaire before the next appointment. Will be scheduled for indirect calorimetry to determine resting energy expenditure in a fasting state.  This will allow us  to create a reduced calorie, high-protein meal plan to promote loss of fat mass while preserving muscle mass. Will avoid skipping meals which may result in increased hunger signals and overeating at certain times. Will work on managing stress via relaxation methods as this may result in unhealthy eating patterns. Will work on reading labels, making healthier choices and watching portion sizes. Counseled on the health benefits of losing 5%-15% of total body weight. Will work on improving sleep hygiene and trying to obtain at least 7 hours of sleep. Was counseled on nutritional approaches to weight loss and benefits of reducing processed foods and consuming plant-based foods and high quality protein as part of nutritional weight management. Was counseled on pharmacotherapy and role as an adjunct in weight management.   Obesity Education Performed Today:  He  was weighed on the bioimpedance scale and results were discussed and documented in the synopsis.  We discussed obesity as a disease and the importance of a more detailed evaluation of all the factors contributing to the disease.  We discussed the importance of long term lifestyle changes which include nutrition, exercise and behavioral modifications as well as the importance of customizing this to his specific health and social needs.  We discussed the benefits of reaching a healthier weight to alleviate the symptoms of existing conditions and reduce the risks of the biomechanical, metabolic and psychological effects of obesity.  Tally Faes appears to be in the action stage of change and states they are ready to start intensive lifestyle modifications and behavioral modifications.  I have spent 30 minutes in the care of the patient today including: preparing to see patient (e.g. review and interpretation of tests, old notes ), obtaining and/or reviewing separately obtained history, performing a medically appropriate examination or evaluation, counseling and educating the patient, documenting clinical information in the electronic or other health care record, and independently interpreting results and communicating results to the patient, family, or caregiver   Reviewed by clinician on day of visit: allergies, medications, problem list, medical history, surgical history, family history, social history, and previous encounter notes pertinent to obesity diagnosis.  Pegah Segel,PA-C

## 2024-04-26 ENCOUNTER — Encounter (INDEPENDENT_AMBULATORY_CARE_PROVIDER_SITE_OTHER): Payer: Self-pay

## 2024-05-02 ENCOUNTER — Encounter (INDEPENDENT_AMBULATORY_CARE_PROVIDER_SITE_OTHER): Payer: Self-pay | Admitting: Internal Medicine

## 2024-05-02 ENCOUNTER — Ambulatory Visit (INDEPENDENT_AMBULATORY_CARE_PROVIDER_SITE_OTHER): Admitting: Internal Medicine

## 2024-05-02 VITALS — BP 118/76 | HR 61 | Temp 98.3°F | Ht 67.0 in | Wt 219.0 lb

## 2024-05-02 DIAGNOSIS — R0602 Shortness of breath: Secondary | ICD-10-CM

## 2024-05-02 DIAGNOSIS — E782 Mixed hyperlipidemia: Secondary | ICD-10-CM | POA: Diagnosis not present

## 2024-05-02 DIAGNOSIS — I10 Essential (primary) hypertension: Secondary | ICD-10-CM | POA: Diagnosis not present

## 2024-05-02 DIAGNOSIS — Z6830 Body mass index (BMI) 30.0-30.9, adult: Secondary | ICD-10-CM | POA: Insufficient documentation

## 2024-05-02 DIAGNOSIS — G4733 Obstructive sleep apnea (adult) (pediatric): Secondary | ICD-10-CM

## 2024-05-02 DIAGNOSIS — Z6834 Body mass index (BMI) 34.0-34.9, adult: Secondary | ICD-10-CM

## 2024-05-02 DIAGNOSIS — Z1331 Encounter for screening for depression: Secondary | ICD-10-CM | POA: Diagnosis not present

## 2024-05-02 DIAGNOSIS — R5383 Other fatigue: Secondary | ICD-10-CM

## 2024-05-02 DIAGNOSIS — E66811 Obesity, class 1: Secondary | ICD-10-CM

## 2024-05-02 DIAGNOSIS — E6609 Other obesity due to excess calories: Secondary | ICD-10-CM | POA: Insufficient documentation

## 2024-05-02 NOTE — Assessment & Plan Note (Signed)
 LDL is at goal. Elevated LDL may be secondary to nutrition, genetics and spillover effect from excess adiposity. Recommended LDL goal is <70 to reduce the risk of fatty streaks and the progression to obstructive ASCVD in the future.   His 10 year risk is: The 10-year ASCVD risk score (Arnett DK, et al., 2019) is: 5%  Lab Results  Component Value Date   CHOL 133 07/20/2023   HDL 49.00 07/20/2023   LDLCALC 66 07/20/2023   TRIG 93.0 07/20/2023   CHOLHDL 3 07/20/2023    Continue weight loss therapy, losing 10% or more of body weight may improve condition. Also advised to reduce saturated fats in diet to less than 10% of daily calories.

## 2024-05-02 NOTE — Assessment & Plan Note (Signed)
 Blood pressure at goal for age and risk category.  On amlodipine  and telmisartan  without adverse effects.  Check renal parameters today.  Losing 10% of body weight may reduce need of antihypertensives.  Continue with weight loss therapy. Losing 10% may improve blood pressure control. Monitor for symptoms of orthostasis while losing weight. Continue current regimen and home monitoring for a goal blood pressure of 120/80.

## 2024-05-02 NOTE — Assessment & Plan Note (Signed)
 He has struggled with obesity for many years, with weight fluctuations and limited success from various diets and supplements. Currently, he is not following a specific diet plan and leads a sedentary lifestyle due to back pain. There is a family history of obesity and significant weight gain over the past five years. Obesity is influenced by genetics, environment, and other factors. He is open to weight loss medications if necessary but prefers lifestyle changes first. Discussed the benefits of a gradual, sustained weight loss approach, aiming for 1-2 pounds per week, and the importance of maintaining weight loss for long-term success. - Initiate a 1500 calorie meal plan focusing on low carb, moderate to high protein intake. - Encourage three meals a day to prevent metabolic slowdown and overeating. - Educate on the importance of protein intake (30 grams per meal) for muscle building and satiety. - Advise on using whole foods and avoiding processed foods to improve gut health. - Discuss the potential for incorporating intermittent fasting in the future. - Plan for regular follow-up to monitor progress and adjust the plan as needed.

## 2024-05-02 NOTE — Progress Notes (Signed)
 [address] [place],  [place], [place] [address]  Office: [phone]  /  Fax: [phone]   Subjective   Initial Visit  Chad Brooks (MR# [id number]) is a 58 y.o. male who presents for evaluation and treatment of obesity and related comorbidities. Current BMI is Body mass index is 34.3 kg/m. Chad Brooks has been struggling with his weight for many years and has been unsuccessful in either losing weight, maintaining weight loss, or reaching his healthy weight goal.  Chad Brooks is currently in the action stage of change and ready to dedicate time achieving and maintaining a healthier weight. Chad Brooks is interested in becoming our patient and working on intensive lifestyle modifications including (but not limited to) diet and exercise for weight loss.  Weight history:  When asked how their weight has affected their life and health, he states: Contributed to medical problems, Having fatigue, Having poor endurance, and Has affected mood   When asked what else they would like to accomplish? He states: Adopt a healthier eating pattern and lifestyle, Improve energy levels and physical activity, Improve existing medical conditions, Reduce number of medications, Improve quality of life, and Improve self-confidence  He starting to note weight gain during :  He has struggled with obesity for many years, experiencing weight fluctuations throughout his life. His weight was 150 pounds in 2017, with a peak of 265 pounds. He has tried various diets, including a restrictive 500-calorie diet called Omnutrition, which resulted in significant weight loss but also led to unhealthy eating patterns and muscle loss. .  Life events associated with weight gain include : marriage.   Other contributing factors: He has struggled with obesity for many years, experiencing weight fluctuations throughout his life. His weight was 150 pounds in 2017, with a peak of 265 pounds. He has tried various diets, including a restrictive 500-calorie  diet called Omnutrition, which resulted in significant weight loss but also led to unhealthy eating patterns and muscle loss.   Their highest weight has been:  265 lbs.  Desired weight: 175  Previous weight-loss programs : Omnitrition, [employer], [employer]CD, [employer].  Their maximum weight loss was:  loss weight but doesn't remember  Their greatest challenge with dieting: cost.  Current or previous pharmacotherapy: Other: supplements.  Response to medication: Lost weight initially but was unable to sustain weight loss   Nutritional History:  Current nutrition plan: None.  How many times do you eat outside the home: 2-4 per week  How often do they skip meals: skip lunchs  What beverages do they drink: caffeinated beverages , regular soda , smoothies, sweet tea , and alcohol, type: vodka, rarely drinks per week.   Use of artificial sweetners : Yes  Food intolerances or dislikes: liver, oysters.  Food triggers: Stress, Boredom, When angry or upset, Seeking reward, To help comfort self, and When Sad.  Food cravings: Sugary and Starches / Carbohydrates  Do they struggle with excessive hunger or portion control : Yes    Physical Activity:  Current level of physical activity: Limited due to chronic pain or orthopedic problems  Barriers to Exercise: orthopedic problems and chronic pain   Past medical history includes:   Past Medical History:  Diagnosis Date   Allergy    Anxiety    Arthritis    Back pain    Constipation    COPD (chronic obstructive pulmonary disease) (HCC)    Depression    Edema of both lower extremities    Emphysema lung (HCC)    GERD (  gastroesophageal reflux disease)    High cholesterol    Hypertension    Joint pain    Knee pain    Plantar fasciitis    Rheumatic fever    Shoulder pain    Sleep apnea    SOB (shortness of breath)      Objective   BP 118/76   Pulse 61   Temp 98.3 F (36.8 C)   Ht 5' 7 (1.702 m)   Wt 219 lb (99.3 kg)    SpO2 96%   BMI 34.30 kg/m  He was weighed on the bioimpedance scale: Body mass index is 34.3 kg/m.    Anthropometrics:  Vitals Temp: 98.3 F (36.8 C) BP: 118/76 Pulse Rate: 61 SpO2: 96 %   Anthropometric Measurements Height: 5' 7 (1.702 m) Weight: 219 lb (99.3 kg) BMI (Calculated): 34.29 Starting Weight: 219 lb Peak Weight: 265 lb Waist Measurement : 48 inches   Body Composition  Body Fat %: 34.7 % Fat Mass (lbs): 76.2 lbs Muscle Mass (lbs): 136.2 lbs Total Body Water (lbs): 102.6 lbs Visceral Fat Rating : 19   Other Clinical Data RMR: 1945 Fasting: yes Labs: yes Today's Visit #: 1 Starting Date: 05/02/24    Physical Exam:  General: He is overweight, cooperative, alert, well developed, and in no acute distress. PSYCH: Has normal mood, affect and thought process.   HEENT: EOMI, sclerae are anicteric. Lungs: Normal breathing effort, no conversational dyspnea. Extremities: No edema.  Neurologic: No gross sensory or motor deficits. No tremors or fasciculations noted.    Diagnostic Data Reviewed    Indirect Calorimeter completed today shows a VO2 of 282 and a REE of 1944.  His calculated basal metabolic rate is 8062 thus his resting energy expenditure same as calculated.  Depression Screen  Chad Brooks PHQ-9 score was: 14.     05/02/2024    7:23 AM  Depression screen PHQ 2/9  Decreased Interest 3  Down, Depressed, Hopeless 1  PHQ - 2 Score 4  Altered sleeping 3  Tired, decreased energy 2  Change in appetite 2  Feeling bad or failure about yourself  2  Trouble concentrating 1  Moving slowly or fidgety/restless 0  Suicidal thoughts 0  PHQ-9 Score 14  Difficult doing work/chores Somewhat difficult    Screening for Sleep Related Breathing Disorders  Chad Brooks admits to daytime somnolence and admits to waking up still tired. Patient has a history of symptoms of daytime fatigue and morning headache. Chad Brooks generally gets 4 hours of sleep per night, and  states that he does not sleep well most nights. Snoring is present. Apneic episodes are present. Epworth Sleepiness Score is 16.   BMET    Component Value Date/Time   NA 137 07/20/2023 1609   K 4.3 07/20/2023 1609   CL 104 07/20/2023 1609   CO2 23 07/20/2023 1609   GLUCOSE 99 07/20/2023 1609   BUN 23 07/20/2023 1609   CREATININE 1.23 07/20/2023 1609   CALCIUM  9.2 07/20/2023 1609   No results found for: HGBA1C No results found for: INSULIN CBC    Component Value Date/Time   WBC 7.1 07/20/2023 1609   RBC 4.52 07/20/2023 1609   HGB 13.8 07/20/2023 1609   HCT 41.6 07/20/2023 1609   PLT 174.0 07/20/2023 1609   MCV 92.0 07/20/2023 1609   MCH 30.0 05/28/2011 2220   MCHC 33.2 07/20/2023 1609   RDW 13.8 07/20/2023 1609   Iron/TIBC/Ferritin/ %Sat No results found for: IRON, TIBC, FERRITIN, IRONPCTSAT Lipid Panel  Component Value Date/Time   CHOL 133 07/20/2023 1609   TRIG 93.0 07/20/2023 1609   HDL 49.00 07/20/2023 1609   CHOLHDL 3 07/20/2023 1609   VLDL 18.6 07/20/2023 1609   LDLCALC 66 07/20/2023 1609   Hepatic Function Panel  No results found for: PROT, ALBUMIN, AST, ALT, ALKPHOS, BILITOT, BILIDIR, IBILI    Component Value Date/Time   TSH 1.17 07/20/2023 1609     Assessment and Plan   TREATMENT PLAN FOR OBESITY:  Recommended Dietary Goals  Chad Brooks is currently in the action stage of change. As such, his goal is to implement medically supervised obesity management plan.  He has agreed to implement: the Category 3 plan - 1500 kcal per day  Behavioral Intervention  We discussed the following Behavioral Modification Strategies today: increasing lean protein intake to established goals, decreasing simple carbohydrates , increasing vegetables, increasing lower glycemic fruits, increasing fiber rich foods, avoiding skipping meals, increasing water intake, work on meal planning and preparation, reading food labels , keeping healthy foods at  home, identifying sources and decreasing liquid calories, decreasing eating out or consumption of processed foods, and making healthy choices when eating convenient foods, planning for success, and better snacking choices  Additional resources provided today: Handout on healthy eating and balanced plate, Handout on complex carbohydrates and lean sources of protein, and Category 3 packet and principles of weight management  Recommended Physical Activity Goals  Chad Brooks has been advised to work up to 150 minutes of moderate intensity aerobic activity a week and strengthening exercises 2-3 times per week for cardiovascular health, weight loss maintenance and preservation of muscle mass.   He has agreed to :  Think about enjoyable ways to increase daily physical activity and overcoming barriers to exercise and Increase physical activity in their day and reduce sedentary time (increase NEAT).  Medical Interventions and Pharmacotherapy We will work on building a Therapist, art and behavioral strategies. We will discuss the role of pharmacotherapy as an adjunct at subsequent visits.   ASSOCIATED CONDITIONS ADDRESSED TODAY  Other Fatigue Chad Brooks admits to daytime somnolence and admits to waking up still tired. Patient has a history of symptoms of daytime fatigue and morning headache. Chad Brooks generally gets 4 hours of sleep per night, and states that he does not sleep well most nights. Snoring is present. Apneic episodes are present. Epworth Sleepiness Score is 16. Chad Brooks does feel that his weight is causing his energy to be lower than it should be. Fatigue may be related to obesity, depression or many other causes. Labs will be ordered, and in the meanwhile, Chad Brooks will focus on self care including making healthy food choices, increasing physical activity and focusing on stress reduction.  Shortness of Breath Antonius notes increasing shortness of breath with physical activity and seems to be  worsening over time with weight gain. He notes getting out of breath sooner with activity than he used to. This has not gotten worse recently. Chad Brooks denies shortness of breath at rest or orthopnea.    Assessment & Plan SOB (shortness of breath) on exertion  Other fatigue  Depression screening PHQ-9 was 14 his mood is directly impacted by his weight.  He will begin medically supervised weight management plan.  Continue to monitor for worsening symptoms. Essential hypertension Blood pressure at goal for age and risk category.  On amlodipine  and telmisartan  without adverse effects.  Check renal parameters today.  Losing 10% of body weight may reduce need of antihypertensives.  Continue with weight loss  therapy. Losing 10% may improve blood pressure control. Monitor for symptoms of orthostasis while losing weight. Continue current regimen and home monitoring for a goal blood pressure of 120/80.   Mixed hyperlipidemia LDL is at goal. Elevated LDL may be secondary to nutrition, genetics and spillover effect from excess adiposity. Recommended LDL goal is <70 to reduce the risk of fatty streaks and the progression to obstructive ASCVD in the future.   His 10 year risk is: The 10-year ASCVD risk score (Arnett DK, et al., 2019) is: 5%  Lab Results  Component Value Date   CHOL 133 07/20/2023   HDL 49.00 07/20/2023   LDLCALC 66 07/20/2023   TRIG 93.0 07/20/2023   CHOLHDL 3 07/20/2023    Continue weight loss therapy, losing 10% or more of body weight may improve condition. Also advised to reduce saturated fats in diet to less than 10% of daily calories.    Class 1 obesity due to excess calories with serious comorbidity and body mass index (BMI) of 34.0 to 34.9 in adult He has struggled with obesity for many years, with weight fluctuations and limited success from various diets and supplements. Currently, he is not following a specific diet plan and leads a sedentary lifestyle due to back pain.  There is a family history of obesity and significant weight gain over the past five years. Obesity is influenced by genetics, environment, and other factors. He is open to weight loss medications if necessary but prefers lifestyle changes first. Discussed the benefits of a gradual, sustained weight loss approach, aiming for 1-2 pounds per week, and the importance of maintaining weight loss for long-term success. - Initiate a 1500 calorie meal plan focusing on low carb, moderate to high protein intake. - Encourage three meals a day to prevent metabolic slowdown and overeating. - Educate on the importance of protein intake (30 grams per meal) for muscle building and satiety. - Advise on using whole foods and avoiding processed foods to improve gut health. - Discuss the potential for incorporating intermittent fasting in the future. - Plan for regular follow-up to monitor progress and adjust the plan as needed. OSA (obstructive sleep apnea) -reports sleep study in the past, untreated We will review records to determine severity.  We discussed the bidirectional relationship between obesity and OSA in association with other conditions.  Losing 15% of body weight may reduce AHI.     General Health Maintenance Emphasized the importance of a balanced diet and regular physical activity for overall health. Discussed the risks of energy drinks, especially given his age and health conditions. - Encourage regular physical activity as tolerated, considering back pain. - Advise on reducing intake of energy drinks and monitoring liquid calorie intake. - Recommend increasing water intake to approximately 100 ounces per day. - Plan for blood work to check insulin resistance, A1c, and B12 levels.  Follow-up Plans to follow up to review blood work results and assess progress with the weight loss plan. - Schedule follow-up appointment to review blood work and assess progress with the weight loss plan. - Monitor  implementation of the 1500 calorie meal plan over the next 4-6 weeks.        Follow-up  He was informed of the importance of frequent follow-up visits to maximize his success with intensive lifestyle modifications for his multiple health conditions. He was informed we would discuss his lab results at his next visit unless there is a critical issue that needs to be addressed sooner. Chad Brooks agreed to keep  his next visit at the agreed upon time to discuss these results.  Attestation Statement  This is the patient's intake visit at [facility]. The patient's Health Questionnaire was reviewed at length. Included in the packet: current and past health history, medications, allergies, ROS, gynecologic history (women only), surgical history, family history, social history, weight history, weight loss surgery history (for those that have had weight loss surgery), nutritional evaluation, mood and food questionnaire, PHQ9, Epworth questionnaire, sleep habits questionnaire, patient life and health improvement goals questionnaire. These will all be scanned into the patient's chart under media.   During the visit, I independently reviewed the patient's, previous labs, bioimpedance scale results, and indirect calorimetry results. I used this information to medically tailor a meal plan for the patient that will help him to lose weight and will improve his obesity-related conditions. I performed a medically necessary appropriate examination and/or evaluation. I discussed the assessment and treatment plan with the patient. The patient was provided an opportunity to ask questions and all were answered. The patient agreed with the plan and demonstrated an understanding of the instructions. Labs were ordered at this visit and will be reviewed at the next visit unless critical results need to be addressed immediately. Clinical information was updated and documented in the EMR.   In addition, they received  basic education on identification of processed foods and reduction of these, different sources of lean proteins and complex carbohydrates and how to eat balanced by incorporation of whole foods.  Reviewed by clinician on day of visit: allergies, medications, problem list, medical history, surgical history, family history, social history, and previous encounter notes.  I have spent 60 minutes in the care of the patient today including: 7 minutes before the visit reviewing and preparing the chart. 38 minutes face-to-face assessing and reviewing listed medical problems as outlined in obesity care plan, providing nutritional and behavioral counseling on topics outlined in the obesity care plan, independently interpreting test results and goals of care, as described in assessment and plan, reviewing and discussing biometric information and progress, and ordering diagnostics - see orders 15 minutes after the visit updating chart and documentation of encounter.       Lucas Parker, MD

## 2024-05-02 NOTE — Assessment & Plan Note (Signed)
 We will review records to determine severity.  We discussed the bidirectional relationship between obesity and OSA in association with other conditions.  Losing 15% of body weight may reduce AHI.

## 2024-05-03 LAB — COMPREHENSIVE METABOLIC PANEL WITH GFR
ALT: 30 IU/L (ref 0–44)
AST: 30 IU/L (ref 0–40)
Albumin: 4.5 g/dL (ref 3.8–4.9)
Alkaline Phosphatase: 110 IU/L (ref 44–121)
BUN/Creatinine Ratio: 23 — ABNORMAL HIGH (ref 9–20)
BUN: 28 mg/dL — ABNORMAL HIGH (ref 6–24)
Bilirubin Total: 0.7 mg/dL (ref 0.0–1.2)
CO2: 19 mmol/L — ABNORMAL LOW (ref 20–29)
Calcium: 9.4 mg/dL (ref 8.7–10.2)
Chloride: 101 mmol/L (ref 96–106)
Creatinine, Ser: 1.21 mg/dL (ref 0.76–1.27)
Globulin, Total: 2.4 g/dL (ref 1.5–4.5)
Glucose: 94 mg/dL (ref 70–99)
Potassium: 4.8 mmol/L (ref 3.5–5.2)
Sodium: 136 mmol/L (ref 134–144)
Total Protein: 6.9 g/dL (ref 6.0–8.5)
eGFR: 69 mL/min/{1.73_m2} (ref >59)

## 2024-05-03 LAB — HEMOGLOBIN A1C
Est. average glucose Bld gHb Est-mCnc: 114 mg/dL
Hgb A1c MFr Bld: 5.6 % (ref 4.8–5.6)

## 2024-05-03 LAB — VITAMIN D 25 HYDROXY (VIT D DEFICIENCY, FRACTURES): Vit D, 25-Hydroxy: 40.4 ng/mL (ref 30.0–100.0)

## 2024-05-03 LAB — INSULIN, RANDOM: INSULIN: 12.5 u[IU]/mL (ref 2.6–24.9)

## 2024-05-03 LAB — VITAMIN B12: Vitamin B-12: >2000 pg/mL — ABNORMAL HIGH (ref 232–1245)

## 2024-05-06 ENCOUNTER — Other Ambulatory Visit: Payer: Self-pay | Admitting: Family Medicine

## 2024-05-06 DIAGNOSIS — S43401A Unspecified sprain of right shoulder joint, initial encounter: Secondary | ICD-10-CM

## 2024-05-07 NOTE — Telephone Encounter (Signed)
 Refill request for Meloxicam  15 mg LR  02/06/24, #30, 2 rf LOV  01/17/24 FOV 05/15/24  Please review and advise. Dm/cma

## 2024-05-15 ENCOUNTER — Encounter: Payer: Self-pay | Admitting: Family Medicine

## 2024-05-15 ENCOUNTER — Ambulatory Visit (INDEPENDENT_AMBULATORY_CARE_PROVIDER_SITE_OTHER): Admitting: Family Medicine

## 2024-05-15 VITALS — BP 130/78 | HR 72 | Temp 97.8°F | Ht 67.0 in | Wt 223.0 lb

## 2024-05-15 DIAGNOSIS — F17201 Nicotine dependence, unspecified, in remission: Secondary | ICD-10-CM | POA: Insufficient documentation

## 2024-05-15 DIAGNOSIS — E782 Mixed hyperlipidemia: Secondary | ICD-10-CM | POA: Diagnosis not present

## 2024-05-15 DIAGNOSIS — I10 Essential (primary) hypertension: Secondary | ICD-10-CM

## 2024-05-15 DIAGNOSIS — E6609 Other obesity due to excess calories: Secondary | ICD-10-CM

## 2024-05-15 DIAGNOSIS — Z6834 Body mass index (BMI) 34.0-34.9, adult: Secondary | ICD-10-CM

## 2024-05-15 DIAGNOSIS — E66811 Obesity, class 1: Secondary | ICD-10-CM

## 2024-05-15 NOTE — Assessment & Plan Note (Signed)
 Support his decision to work with [facility]. I encourage him to not delay in starting to get more exercise. I do acknowledge the challenges for him related to his current work schedule.

## 2024-05-15 NOTE — Progress Notes (Signed)
 [facility] [address] [place] [address] Dept: [phone] Dept Fax: [phone]  Chronic Care Office Visit  Subjective:    Patient ID: Chad Brooks, male    DOB: 1966-03-22, 58 y.o..   MRN: [id number]  Chief Complaint  Patient presents with   Hypertension    4 month f/u HTN.  ? IBS.    History of Present Illness:  Patient is in today for reassessment of chronic medical issues.  Chad Brooks has a history of hypertension. He is managed on amlodipine  5 mg daily and telmisartan  80 mg daily.   Chad Brooks has a history of hyperlipidemia. He is managed on rosuvastatin  10 mg daily.  Chad Brooks notes that he has started to go to [facility] fat program. He is early in making interventions to try and reduce his weight. One of his barriers to exercise is the work hours that he is under. He typically gets up at 3:15 am so he can be at work by 5:00 am. He then works 10 hour days. He is finding lack of time and energy at the end of the day for exercise. He also complains of numerous orthopedic complains including back and hip joint pain that limits his activities. 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 He does feel a relief at being out form under this. Despite this change, he notes ongoing issues with insomnia. He has known sleep apnea, but had found using a CPAP to not work well for him,a s he would pull the device off in his sleep.  Past Medical History: Patient Active Problem List   Diagnosis Date Noted   Tobacco dependence in remission 05/15/2024   OSA (obstructive sleep apnea) -reports sleep study in the past, untreated 05/02/2024   Class 1 obesity due to excess calories with serious comorbidity and body mass index (BMI) of 34.0 to 34.9 in adult 05/02/2024   Aortic atherosclerosis (HCC) 08/19/2023   Coronary atherosclerosis 08/19/2023   Emphysema of lung (HCC) 08/19/2023   Essential  hypertension 07/20/2023   Arthritis of carpometacarpal (CMC) joint of both thumbs 07/20/2023   Hyperlipidemia 07/20/2023   Benign prostatic hyperplasia 07/20/2023   Fuchs' corneal dystrophy 07/20/2023   History of rheumatic fever as a child 07/20/2023   Past Surgical History:  Procedure Laterality Date   ANTERIOR CRUCIATE LIGAMENT REPAIR     BUNIONECTOMY Right    FRACTURE SURGERY  12/09/2016   Left humerus repair   HUMERUS FRACTURE SURGERY Left 12/09/2016   Left humerus repair   KNEE ARTHROSCOPY Left 1993   ROTATOR CUFF REPAIR Right    x 2   Family History  Problem Relation Age of Onset   COPD Mother    Heart disease Mother    Miscarriages / [place] Mother    Vision loss Mother    Hypertension Mother    Hyperlipidemia Mother    Hearing loss Father    Peripheral Artery Disease Father    Cancer Sister        Breast   Birth defects Brother    Diabetes Maternal Grandmother    Heart disease Maternal Grandmother    Heart disease Paternal Uncle    Outpatient Medications Prior to Visit  Medication Sig Dispense Refill   amLODipine  (NORVASC ) 5 MG tablet Take 1 tablet (5 mg total) by mouth daily. 90 tablet 3   Ascorbic Acid (VITAMIN C) 1000 MG tablet Take 1,000 mg by mouth  daily.     aspirin  (ASPIRIN  CHILDRENS) 81 MG chewable tablet Chew 1 tablet (81 mg total) by mouth daily.     meloxicam  (MOBIC ) 15 MG tablet TAKE 1 TABLET BY MOUTH DAILY (Patient taking differently: Take 15 mg by mouth as needed for pain.) 30 tablet 2   methocarbamol (ROBAXIN) 750 MG tablet Take 750 mg by mouth every 6 (six) hours as needed for muscle spasms.     Multiple Vitamin (ONE-A-DAY MENS PO) Take by mouth.     rosuvastatin  (CRESTOR ) 10 MG tablet Take 1 tablet (10 mg total) by mouth daily. 90 tablet 3   tadalafil  (CIALIS ) 5 MG tablet Take 1 tablet (5 mg total) by mouth daily as needed for erectile dysfunction. 10 tablet 11   telmisartan  (MICARDIS ) 80 MG tablet Take 1 tablet (80 mg total) by mouth daily.  90 tablet 3   traMADol  (ULTRAM ) 50 MG tablet Take 50 mg by mouth every 6 (six) hours as needed.     Zinc 50 MG TABS      Cobalamin Combinations (B-12) [id number] MCG SUBL      Misc Natural Products (OSTEO BI-FLEX ADV TRIPLE ST PO)  (Patient not taking: Reported on 05/02/2024)     No facility-administered medications prior to visit.   No Known Allergies Objective:   Today's Vitals   05/15/24 1506  BP: 130/78  Pulse: 72  Temp: 97.8 F (36.6 C)  TempSrc: Temporal  SpO2: 98%  Weight: 223 lb (101.2 kg)  Height: 5' 7 (1.702 m)   Body mass index is 34.93 kg/m.   General: Well developed, well nourished. No acute distress. Psych: Alert and oriented. Normal mood and affect.  Health Maintenance Due  Topic Date Due   HIV Screening  Never done   Pneumococcal Vaccine 40-63 Years old (1 of 2 - PCV) Never done   Hepatitis B Vaccines (1 of 3 - 19+ 3-dose series) Never done     Lab Results:    Latest Ref Rng & Units 05/02/2024    8:49 AM 07/20/2023    4:09 PM 05/28/2011   10:38 PM  BMP  Glucose 70 - 99 mg/dL 94  99  884   BUN 6 - 24 mg/dL 28  23  31    Creatinine 0.76 - 1.27 mg/dL 8.78  8.76  8.59   BUN/Creat Ratio 9 - 20 23     Sodium 134 - 144 mmol/L 136  137  140   Potassium 3.5 - 5.2 mmol/L 4.8  4.3  3.8   Chloride 96 - 106 mmol/L 101  104  107   CO2 20 - 29 mmol/L 19  23    Calcium  8.7 - 10.2 mg/dL 9.4  9.2      Assessment & Plan:   Problem List Items Addressed This Visit       Cardiovascular and Mediastinum   Essential hypertension - Primary   Blood pressure is in good control. Continue amlodipine  5 mg and telmisartan  80 mg daily.         Other   Class 1 obesity due to excess calories with serious comorbidity and body mass index (BMI) of 34.0 to 34.9 in adult   Support his decision to work with Chad Brooks. I encourage him to not delay in starting to get more exercise. I do acknowledge the challenges for him related to his current work schedule.       Hyperlipidemia   LDL cholesterol is at goal. Continue rosuvastatin  10  mg daily.       Return in about 3 months (around 08/15/2024) for Reassessment.   Chad CHRISTELLA Simpler, MD

## 2024-05-15 NOTE — Assessment & Plan Note (Signed)
 Blood pressure is in good control. Continue amlodipine  5 mg and telmisartan  80 mg daily.

## 2024-05-15 NOTE — Assessment & Plan Note (Signed)
 LDL cholesterol is at goal. Continue rosuvastatin 10 mg daily.

## 2024-05-16 ENCOUNTER — Ambulatory Visit: Admitting: Family Medicine

## 2024-05-16 ENCOUNTER — Ambulatory Visit (INDEPENDENT_AMBULATORY_CARE_PROVIDER_SITE_OTHER): Admitting: Internal Medicine

## 2024-05-16 ENCOUNTER — Encounter (INDEPENDENT_AMBULATORY_CARE_PROVIDER_SITE_OTHER): Payer: Self-pay | Admitting: Internal Medicine

## 2024-05-16 VITALS — BP 128/80 | HR 78 | Temp 98.0°F | Ht 67.0 in | Wt 215.0 lb

## 2024-05-16 DIAGNOSIS — E66811 Obesity, class 1: Secondary | ICD-10-CM

## 2024-05-16 DIAGNOSIS — I10 Essential (primary) hypertension: Secondary | ICD-10-CM | POA: Diagnosis not present

## 2024-05-16 DIAGNOSIS — I2510 Atherosclerotic heart disease of native coronary artery without angina pectoris: Secondary | ICD-10-CM

## 2024-05-16 DIAGNOSIS — G4730 Sleep apnea, unspecified: Secondary | ICD-10-CM

## 2024-05-16 DIAGNOSIS — G4733 Obstructive sleep apnea (adult) (pediatric): Secondary | ICD-10-CM

## 2024-05-16 DIAGNOSIS — Z6834 Body mass index (BMI) 34.0-34.9, adult: Secondary | ICD-10-CM

## 2024-05-16 NOTE — Progress Notes (Deleted)
 Office: [phone]  /  Fax: [phone]  Weight Summary And Body Composition Analysis (BIA)  Vitals Temp: 98 F (36.7 C) BP: 128/80 Pulse Rate: 78 SpO2: 98 %   Anthropometric Measurements Height: 5' 7 (1.702 m) Weight: 215 lb (97.5 kg) BMI (Calculated): 33.67 Weight at Last Visit: 219 lb Weight Lost Since Last Visit: 4 lb Weight Gained Since Last Visit: 0 lb Starting Weight: 219  lb Total Weight Loss (lbs): 4 lb (1.814 kg) Peak Weight: 265 lb   Body Composition  Body Fat %: 33.1 % Fat Mass (lbs): 71.2 lbs Muscle Mass (lbs): 136.6 lbs Total Body Water (lbs): 104.4 lbs Visceral Fat Rating : 18  The 10-year ASCVD risk score (Arnett DK, et al., 2019) is: 5.7%   RMR: 1944  Today's Visit #: 2  Starting Date: 05/02/24   Subjective   Chief Complaint: Obesity  Chad Brooks is here to discuss his progress with his obesity treatment plan. He is following {emwtlossplannewint:31639} and states he is following his eating plan approximately {empercentages:32441}% of the time. He states he is {emyesnoexercise:32811}.  Weight Progress Since Last Visit:  Since last office visit he has {emweight change:30888}. He reports {EMADHERENCE:28838::good adherence to reduced calorie nutritional plan.} He has been working on [employer] labels,not skipping meals,increasing protein intake at every meal,drinking more water,making healthier choices,reducing portion sizes,incorporating more whole foods}   Nutritional 24 HR Recall: {emfoodrecall:33020::Intake consistent with prescribed nutritional plan}  Challenges affecting patient progress: {EMOBESITYBARRIERS:28841::none}.   Orexigenic Control: {Actions; denies-reports:32002} problems with appetite and hunger signals.  {Actions; denies-reports:32002} problems with satiety and satiation.  {Actions; denies-reports:32002} problems with eating patterns and portion control.  {Actions;  denies-reports:32002} abnormal cravings. {Actions; denies-reports:32002} feeling deprived or restricted.   Pharmacotherapy for weight management: He is currently taking {EMPharmaco:28845}.   Assessment and Plan   Treatment Plan For Obesity:  Recommended Dietary Goals  Chad Brooks is currently in the action stage of change. As such, his goal is to continue weight management plan. He has agreed to: {EMWTLOSSPLAN:29297::continue current plan}  Behavioral Health and Counseling  We discussed the following behavioral modification strategies today: {EMWMwtlossstrategies:28914::continue to work on maintaining a reduced calorie state, getting the recommended amount of protein, incorporating whole foods, making healthy choices, staying well hydrated and practicing mindfulness when eating.}.  Additional education and resources provided today: {EMadditionalresources:29169::None}  Recommended Physical Activity Goals  Chad Brooks has been advised to work up to 150 minutes of moderate intensity aerobic activity a week and strengthening exercises 2-3 times per week for cardiovascular health, weight loss maintenance and preservation of muscle mass.   He has agreed to :  {EMEXERCISE:28847::Think about enjoyable ways to increase daily physical activity and overcoming barriers to exercise,Increase physical activity in their day and reduce sedentary time (increase NEAT).}  Pharmacotherapy and Medical Interventions  {EMagreedrx:29170}  Associated Conditions Impacted by Obesity Treatment  Assessment & Plan     Objective   Physical Exam:  Blood pressure 128/80, pulse 78, temperature 98 F (36.7 C), height 5' 7 (1.702 m), weight 215 lb (97.5 kg), SpO2 98%. Body mass index is 33.67 kg/m.  General: He is overweight, cooperative, alert, well developed, and in no acute distress. PSYCH: Has normal mood, affect and thought process.   HEENT: EOMI, sclerae are anicteric. Lungs: Normal breathing  effort, no conversational dyspnea. Extremities: No edema.  Neurologic: No gross sensory or motor deficits. No tremors or fasciculations noted.    Diagnostic Data Reviewed:  BMET    Component Value Date/Time   NA 136 05/02/2024 0849  K 4.8 05/02/2024 0849   CL 101 05/02/2024 0849   CO2 19 (L) 05/02/2024 0849   GLUCOSE 94 05/02/2024 0849   GLUCOSE 99 07/20/2023 1609   BUN 28 (H) 05/02/2024 0849   CREATININE 1.21 05/02/2024 0849   CALCIUM  9.4 05/02/2024 0849   Lab Results  Component Value Date   HGBA1C 5.6 05/02/2024   Lab Results  Component Value Date   INSULIN  12.5 05/02/2024   Lab Results  Component Value Date   TSH 1.17 07/20/2023   CBC    Component Value Date/Time   WBC 7.1 07/20/2023 1609   RBC 4.52 07/20/2023 1609   HGB 13.8 07/20/2023 1609   HCT 41.6 07/20/2023 1609   PLT 174.0 07/20/2023 1609   MCV 92.0 07/20/2023 1609   MCH 30.0 05/28/2011 2220   MCHC 33.2 07/20/2023 1609   RDW 13.8 07/20/2023 1609   Iron Studies No results found for: IRON, TIBC, FERRITIN, IRONPCTSAT Lipid Panel     Component Value Date/Time   CHOL 133 07/20/2023 1609   TRIG 93.0 07/20/2023 1609   HDL 49.00 07/20/2023 1609   CHOLHDL 3 07/20/2023 1609   VLDL 18.6 07/20/2023 1609   LDLCALC 66 07/20/2023 1609   Hepatic Function Panel     Component Value Date/Time   PROT 6.9 05/02/2024 0849   ALBUMIN 4.5 05/02/2024 0849   AST 30 05/02/2024 0849   ALT 30 05/02/2024 0849   ALKPHOS 110 05/02/2024 0849   BILITOT 0.7 05/02/2024 0849      Component Value Date/Time   TSH 1.17 07/20/2023 1609   Nutritional Lab Results  Component Value Date   VD25OH 40.4 05/02/2024   VD25OH 45.80 07/20/2023    Medications: Outpatient Encounter Medications as of 05/16/2024  Medication Sig   amLODipine  (NORVASC ) 5 MG tablet Take 1 tablet (5 mg total) by mouth daily.   Ascorbic Acid (VITAMIN C) 1000 MG tablet Take 1,000 mg by mouth daily.   aspirin  (ASPIRIN  CHILDRENS) 81 MG chewable  tablet Chew 1 tablet (81 mg total) by mouth daily.   meloxicam  (MOBIC ) 15 MG tablet TAKE 1 TABLET BY MOUTH DAILY (Patient taking differently: Take 15 mg by mouth as needed for pain.)   methocarbamol (ROBAXIN) 750 MG tablet Take 750 mg by mouth every 6 (six) hours as needed for muscle spasms.   Multiple Vitamin (ONE-A-DAY MENS PO) Take by mouth.   rosuvastatin  (CRESTOR ) 10 MG tablet Take 1 tablet (10 mg total) by mouth daily.   tadalafil  (CIALIS ) 5 MG tablet Take 1 tablet (5 mg total) by mouth daily as needed for erectile dysfunction.   telmisartan  (MICARDIS ) 80 MG tablet Take 1 tablet (80 mg total) by mouth daily.   traMADol  (ULTRAM ) 50 MG tablet Take 50 mg by mouth every 6 (six) hours as needed.   Zinc 50 MG TABS    No facility-administered encounter medications on file as of 05/16/2024.     Follow-Up   No follow-ups on file.SABRA He was informed of the importance of frequent follow up visits to maximize his success with intensive lifestyle modifications for his multiple health conditions.  Attestation Statement   Reviewed by clinician on day of visit: allergies, medications, problem list, medical history, surgical history, family history, social history, and previous encounter notes.     Lucas Parker, MD

## 2024-05-16 NOTE — Progress Notes (Signed)
 Office: [phone]  /  Fax: [phone]  Weight Summary and Body Composition Analysis (BIA)  Vitals Temp: 98 F (36.7 C) BP: 128/80 Pulse Rate: 78 SpO2: 98 %   Anthropometric Measurements Height: 5' 7 (1.702 m) Weight: 215 lb (97.5 kg) BMI (Calculated): 33.67 Weight at Last Visit: 219 lb Weight Lost Since Last Visit: 4 lb Weight Gained Since Last Visit: 0 lb Starting Weight: 219  lb Total Weight Loss (lbs): 4 lb (1.814 kg) Peak Weight: 265 lb   Body Composition  Body Fat %: 33.1 % Fat Mass (lbs): 71.2 lbs Muscle Mass (lbs): 136.6 lbs Total Body Water (lbs): 104.4 lbs Visceral Fat Rating : 18    RMR: 1945  Today's Visit #: 2  Starting Date: 05/02/24   Subjective   Chief Complaint: Obesity  Interval History Discussed the use of AI scribe software for clinical note transcription with the patient, who gave verbal consent to proceed.  History of Present Illness   Chad Brooks is a 58 year old male who presents for a post intake appointment following a recent weight loss.  He has lost 4 pounds.  He is following a category 3 meal plan with good adherence.  He is not exercising  He has experienced a weight loss of four pounds since his last office visit. He started a new diet on July 7th. His breakfast typically includes one piece of multigrain bread with eggs and cheese toast. He inquires about the suitability of Jif peanut butter in his diet, which he has been consuming without issues. Lunch consists of a large sandwich made with [place] low sodium [place] or ham, [place] cheese, and lettuce, accompanied by yogurt and an apple. He enjoys a generous portion of spring mix salad with tomatoes and onions, dressed with Skinny Girl balsamic vinaigrette. Dinner includes various meats such as pork chops, steak, or leftover hamburger meat, with vegetables like broccoli and salad. He has a history of fluctuating weight and previous dieting attempts, including a  significant weight loss from 265 pounds to 150 pounds eight years ago, achieved by consuming only 500 calories a day. He wants to eat more while losing weight this time.  He reports difficulty sleeping, attributing it to anxiety and an inability to clear his mind. 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  He is currently taking aspirin , Cialis  as needed, Mobic , amlodipine , Robaxin, Crestor , telmisartan , tramadol , and zinc daily. He has stopped taking B12 supplements. He has a history of smoking and is undergoing regular lung cancer screenings.  He works in a physically demanding job and tries to incorporate additional activity by parking further away and walking more. He mentions drinking Gatorade Zero but is concerned about its artificial sweeteners.       Challenges affecting patient progress: none.    Pharmacotherapy for weight management: He is currently taking no anti-obesity medication.   Assessment and Plan   Treatment Plan For Obesity:  Recommended Dietary Goals  Cloyd is currently in the action stage of change. As such, his goal is to continue weight management plan. He has agreed to: continue current plan  Behavioral Health and Counseling  We discussed the following behavioral modification strategies today: continue to work on maintaining a reduced calorie state, getting the recommended amount of protein, incorporating whole foods, making healthy choices, staying well hydrated and practicing mindfulness when eating..  Additional education and resources provided today:  None  Recommended Physical Activity Goals  Dawon has been advised to work up to 150 minutes of moderate intensity aerobic activity a week and strengthening exercises 2-3 times per week for cardiovascular health, weight loss maintenance and preservation of muscle mass.   He has agreed to :  Think about enjoyable ways to increase daily  physical activity and overcoming barriers to exercise and Increase physical activity in their day and reduce sedentary time (increase NEAT).  Medical Interventions and Pharmacotherapy  We discussed various medication options to help [facility] with his weight loss efforts and we both agreed to : Continue with current nutritional and behavioral strategies  Associated Conditions Impacted by Obesity Treatment  Assessment & Plan Essential hypertension  Atherosclerosis of coronary artery of native heart, unspecified vessel or lesion type, unspecified whether angina present  OSA (obstructive sleep apnea) -reports sleep study in the past, untreated  Class 1 obesity due to excess calories with serious comorbidity and body mass index (BMI) of 34.0 to 34.9 in adult     Assessment and Plan    Weight Management He has lost four pounds since the last visit, with a reduction in body fat percentage to 33%. He follows a dietary plan including multigrain bread, peanut butter, [place] or ham sandwiches, yogurt, and apples. He is satisfied with his meals but struggles with bread in the morning. He is not feeling deprived and is committed to the plan. His insulin  resistance is noted, with an insulin  level slightly above normal, indicating a risk for diabetes if not managed. - Continue current dietary plan with modifications as needed - Consider alternative breakfast options such as mal[place] sausage, mal[place] bacon, or protein cereals - Use the YUKA app to evaluate food choices - Encourage physical activity to aid weight loss and improve insulin  sensitivity  Cardiovascular Risk Management He has plaque buildup as seen on a CT scan. He is on a statin, aspirin , and antihypertensives to manage cardiovascular risk. His cholesterol and blood pressure are well controlled. Continuing these medications is crucial to prevent cardiovascular events. A 10% weight loss could potentially allow for reevaluation of medication needs,  but the presence of plaque necessitates ongoing statin therapy to stabilize and reduce plaque. - Continue rosuvastatin , aspirin , and antihypertensives - Maintain current lifestyle modifications to reduce cardiovascular risk  Sleep Apnea He has a history of sleep apnea, which may contribute to decreased bicarbonate.  May also be a lab error he reports difficulty sleeping due to anxiety and stress, which may exacerbate his condition. - Repeat CMP in 4 weeks at - Discuss potential need for sleep study if symptoms persist  Anxiety He reports difficulty sleeping due to anxiety related to job stress and other personal issues. He has discussed this with another physician who suggested therapy. - Consider therapy for anxiety management  Vitamin B12 Supplementation His B12 levels are elevated due to previous daily supplementation with 5000 mcg. He has since stopped taking the supplement. - Discontinue daily B12 supplementation - Consider monthly B12 supplementation if needed  General Health Maintenance He is due for lung cancer screening in October 2025 and a colonoscopy at age 79. He qualifies for an abdominal aortic aneurysm screening at age 55 due to his smoking history. Prostate cancer screening is ongoing with PSA levels checked every two years. - Schedule lung cancer screening in October 2025 - Plan for colonoscopy at age 14 - Consider abdominal aortic aneurysm screening at age 36 - Continue prostate cancer screening every two years  Follow-up He  is scheduled for a follow-up appointment in three weeks to monitor progress and adjust plans as needed. - Schedule follow-up appointment in three weeks        Objective   Physical Exam:  Blood pressure 128/80, pulse 78, temperature 98 F (36.7 C), height 5' 7 (1.702 m), weight 215 lb (97.5 kg), SpO2 98%. Body mass index is 33.67 kg/m.  General: He is overweight, cooperative, alert, well developed, and in no acute distress. PSYCH: Has  normal mood, affect and thought process.   HEENT: EOMI, sclerae are anicteric. Lungs: Normal breathing effort, no conversational dyspnea. Extremities: No edema.  Neurologic: No gross sensory or motor deficits. No tremors or fasciculations noted.    Diagnostic Data Reviewed:  BMET    Component Value Date/Time   NA 136 05/02/2024 0849   K 4.8 05/02/2024 0849   CL 101 05/02/2024 0849   CO2 19 (L) 05/02/2024 0849   GLUCOSE 94 05/02/2024 0849   GLUCOSE 99 07/20/2023 1609   BUN 28 (H) 05/02/2024 0849   CREATININE 1.21 05/02/2024 0849   CALCIUM  9.4 05/02/2024 0849   Lab Results  Component Value Date   HGBA1C 5.6 05/02/2024   Lab Results  Component Value Date   INSULIN  12.5 05/02/2024   Lab Results  Component Value Date   TSH 1.17 07/20/2023   CBC    Component Value Date/Time   WBC 7.1 07/20/2023 1609   RBC 4.52 07/20/2023 1609   HGB 13.8 07/20/2023 1609   HCT 41.6 07/20/2023 1609   PLT 174.0 07/20/2023 1609   MCV 92.0 07/20/2023 1609   MCH 30.0 05/28/2011 2220   MCHC 33.2 07/20/2023 1609   RDW 13.8 07/20/2023 1609   Iron Studies No results found for: IRON, TIBC, FERRITIN, IRONPCTSAT Lipid Panel     Component Value Date/Time   CHOL 133 07/20/2023 1609   TRIG 93.0 07/20/2023 1609   HDL 49.00 07/20/2023 1609   CHOLHDL 3 07/20/2023 1609   VLDL 18.6 07/20/2023 1609   LDLCALC 66 07/20/2023 1609   Hepatic Function Panel     Component Value Date/Time   PROT 6.9 05/02/2024 0849   ALBUMIN 4.5 05/02/2024 0849   AST 30 05/02/2024 0849   ALT 30 05/02/2024 0849   ALKPHOS 110 05/02/2024 0849   BILITOT 0.7 05/02/2024 0849      Component Value Date/Time   TSH 1.17 07/20/2023 1609   Nutritional Lab Results  Component Value Date   VD25OH 40.4 05/02/2024   VD25OH 45.80 07/20/2023    Medications: Outpatient Encounter Medications as of 05/16/2024  Medication Sig   amLODipine  (NORVASC ) 5 MG tablet Take 1 tablet (5 mg total) by mouth daily.   Ascorbic Acid  (VITAMIN C) 1000 MG tablet Take 1,000 mg by mouth daily.   aspirin  (ASPIRIN  CHILDRENS) 81 MG chewable tablet Chew 1 tablet (81 mg total) by mouth daily.   meloxicam  (MOBIC ) 15 MG tablet TAKE 1 TABLET BY MOUTH DAILY (Patient taking differently: Take 15 mg by mouth as needed for pain.)   methocarbamol (ROBAXIN) 750 MG tablet Take 750 mg by mouth every 6 (six) hours as needed for muscle spasms.   Multiple Vitamin (ONE-A-DAY MENS PO) Take by mouth.   rosuvastatin  (CRESTOR ) 10 MG tablet Take 1 tablet (10 mg total) by mouth daily.   tadalafil  (CIALIS ) 5 MG tablet Take 1 tablet (5 mg total) by mouth daily as needed for erectile dysfunction.   telmisartan  (MICARDIS ) 80 MG tablet Take 1 tablet (80 mg total) by mouth daily.  traMADol  (ULTRAM ) 50 MG tablet Take 50 mg by mouth every 6 (six) hours as needed.   Zinc 50 MG TABS    No facility-administered encounter medications on file as of 05/16/2024.     Follow-Up   No follow-ups on file.SABRA He was informed of the importance of frequent follow up visits to maximize his success with intensive lifestyle modifications for his multiple health conditions.  Attestation Statement   Reviewed by clinician on day of visit: allergies, medications, problem list, medical history, surgical history, family history, social history, and previous encounter notes.   I have spent 43 minutes in the care of the patient today including: 3 minutes before the visit reviewing and preparing the chart. 35 minutes face-to-face assessing and reviewing listed medical problems as outlined in obesity care plan, providing nutritional and behavioral counseling on topics outlined in the obesity care plan, independently interpreting test results and goals of care, as described in assessment and plan, and reviewing and discussing biometric information and progress 5 minutes after the visit updating chart and documentation of encounter.    Lucas Parker, MD

## 2024-05-31 ENCOUNTER — Encounter (INDEPENDENT_AMBULATORY_CARE_PROVIDER_SITE_OTHER): Payer: Self-pay | Admitting: Internal Medicine

## 2024-05-31 ENCOUNTER — Ambulatory Visit (INDEPENDENT_AMBULATORY_CARE_PROVIDER_SITE_OTHER): Admitting: Internal Medicine

## 2024-05-31 VITALS — BP 104/76 | HR 65 | Temp 98.1°F | Ht 67.0 in | Wt 211.0 lb

## 2024-05-31 DIAGNOSIS — E782 Mixed hyperlipidemia: Secondary | ICD-10-CM | POA: Diagnosis not present

## 2024-05-31 DIAGNOSIS — I10 Essential (primary) hypertension: Secondary | ICD-10-CM

## 2024-05-31 DIAGNOSIS — G4733 Obstructive sleep apnea (adult) (pediatric): Secondary | ICD-10-CM

## 2024-05-31 DIAGNOSIS — E66811 Obesity, class 1: Secondary | ICD-10-CM | POA: Diagnosis not present

## 2024-05-31 DIAGNOSIS — Z6834 Body mass index (BMI) 34.0-34.9, adult: Secondary | ICD-10-CM

## 2024-05-31 NOTE — Progress Notes (Unsigned)
 Office: [phone]  /  Fax: [phone]  Weight Summary and Body Composition Analysis (BIA)  Vitals Temp: 98.1 F (36.7 C) BP: 104/76 Pulse Rate: 65 SpO2: 96 %   Anthropometric Measurements Height: 5' 7 (1.702 m) Weight: 211 lb (95.7 kg) BMI (Calculated): 33.04 Weight at Last Visit: 215 lb Weight Lost Since Last Visit: 4 lb Weight Gained Since Last Visit: 0 lb Starting Weight: 219 lb Total Weight Loss (lbs): 8 lb (3.629 kg) Peak Weight: 265 lb   Body Composition  Body Fat %: 32 % Fat Mass (lbs): 67.6 lbs Muscle Mass (lbs): 136.2 lbs Total Body Water (lbs): 106.6 lbs Visceral Fat Rating : 18    RMR: 1944  Today's Visit #: 3  Starting Date: 05/02/24   Subjective   Chief Complaint: Obesity  Interval History Discussed the use of AI scribe software for clinical note transcription with the patient, who gave verbal consent to proceed.  History of Present Illness   Chad Brooks is a 58 year old male who presents for medical weight management.  Chad Brooks has lost four pounds since his last visit, with a decrease in body fat percentage while maintaining muscle mass. This is attributed to following a prescribed diet plan, which includes adequate protein intake. Chad Brooks notes improved ease in climbing stairs at work and less leg pain. Chad Brooks also mentions feeling lighter and having more energy, although Chad Brooks still experiences some tiredness and sleep disturbances.  His dietary habits include consuming three eggs and two pieces of bread for breakfast, and a sandwich with lettuce and cheese for lunch. Chad Brooks occasionally drinks more milk than recommended and has indulged in meals outside his diet plan during special occasions, such as dining at [employer] and having ribs and chicken wings during a social gathering. Despite these deviations, Chad Brooks maintains that Chad Brooks returns to his diet plan promptly.  His wife is also participating in a weight management program and has joined a gym, which Chad Brooks  has not yet done due to fatigue from work. 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 Chad Brooks is considering using a pedometer to track his steps.  His blood pressure is decreasing. Chad Brooks drinks water and occasionally Gatorade to stay hydrated, especially in hot weather.  Chad Brooks uses the [place] app to evaluate the nutritional quality of his food choices and has identified some areas for improvement, such as switching to a healthier bread option. Chad Brooks is open to exploring new food options and grocery stores to support his dietary goals.      BP: 104/76   Challenges affecting patient progress: none.    Pharmacotherapy for weight management: Chad Brooks is currently taking no anti-obesity medication.   Assessment and Plan   Treatment Plan For Obesity:  Recommended Dietary Goals  Chad Brooks is currently in the action stage of change. As such, his goal is to continue weight management plan. Chad Brooks has agreed to: continue current plan  Behavioral Health and Counseling  We discussed the following behavioral modification strategies today: continue to work on maintaining a reduced calorie state, getting the recommended amount of protein, incorporating whole foods, making healthy choices, staying well hydrated and practicing mindfulness when eating..  Additional education and resources provided today: None  Recommended Physical Activity Goals  Chad Brooks has been advised to work up to 150 minutes of moderate intensity aerobic activity a week and strengthening exercises 2-3 times per week for cardiovascular  health, weight loss maintenance and preservation of muscle mass.   Chad Brooks has agreed to :  Chad Brooks has a high degree of nonexercise activity at work I encouraged him to obtain a pedometer to track his steps.  Chad Brooks is thinking about starting to go to the gym in the future.    Medical Interventions and Pharmacotherapy  We discussed various  medication options to help [facility] with his weight loss efforts and we both agreed to : Continue with current nutritional and behavioral strategies  Associated Conditions Impacted by Obesity Treatment  Assessment & Plan Essential hypertension Blood pressure at goal for age and risk category.  On amlodipine  and telmisartan  without adverse effects.  Check renal parameters today.  Losing 10% of body weight may reduce need of antihypertensives.  Continue with weight loss therapy. Losing 10% may improve blood pressure control. Monitor for symptoms of orthostasis while losing weight. Continue current regimen and home monitoring for a goal blood pressure of 120/80.   OSA (obstructive sleep apnea) -reports sleep study in the past, untreated We will review records to determine severity.  We discussed the bidirectional relationship between obesity and OSA in association with other conditions.  Losing 15% of body weight may reduce AHI. Class 1 obesity due to excess calories with serious comorbidity and body mass index (BMI) of 34.0 to 34.9 in adult Patient has lost a total of 8 pounds since initiating program bioimpedance information shows reductions in SAT.  There is also improvement in blood pressure control.  Chad Brooks will continue current weight management strategy.  Chad Brooks will be monitoring for orthostasis which would indicate reductions in blood pressure medication. Mixed hyperlipidemia LDL is at goal. Elevated LDL may be secondary to nutrition, genetics and spillover effect from excess adiposity. Recommended LDL goal is <70 to reduce the risk of fatty streaks and the progression to obstructive ASCVD in the future.   His 10 year risk is: The 10-year ASCVD risk score (Arnett DK, et al., 2019) is: 4%  Lab Results  Component Value Date   CHOL 133 07/20/2023   HDL 49.00 07/20/2023   LDLCALC 66 07/20/2023   TRIG 93.0 07/20/2023   CHOLHDL 3 07/20/2023   Chad Brooks is on rosuvastatin  10 mg a day  Continue weight loss  therapy, losing 10% or more of body weight may improve condition.  Continue statin therapy        Objective   Physical Exam:  Blood pressure 104/76, pulse 65, temperature 98.1 F (36.7 C), height 5' 7 (1.702 m), weight 211 lb (95.7 kg), SpO2 96%. Body mass index is 33.05 kg/m.  General: Chad Brooks is overweight, cooperative, alert, well developed, and in no acute distress. PSYCH: Has normal mood, affect and thought process.   HEENT: EOMI, sclerae are anicteric. Lungs: Normal breathing effort, no conversational dyspnea. Extremities: No edema.  Neurologic: No gross sensory or motor deficits. No tremors or fasciculations noted.    Diagnostic Data Reviewed:  BMET    Component Value Date/Time   NA 136 05/02/2024 0849   K 4.8 05/02/2024 0849   CL 101 05/02/2024 0849   CO2 19 (L) 05/02/2024 0849   GLUCOSE 94 05/02/2024 0849   GLUCOSE 99 07/20/2023 1609   BUN 28 (H) 05/02/2024 0849   CREATININE 1.21 05/02/2024 0849   CALCIUM  9.4 05/02/2024 0849   Lab Results  Component Value Date   HGBA1C 5.6 05/02/2024   Lab Results  Component Value Date   INSULIN  12.5 05/02/2024   Lab Results  Component Value Date  TSH 1.17 07/20/2023   CBC    Component Value Date/Time   WBC 7.1 07/20/2023 1609   RBC 4.52 07/20/2023 1609   HGB 13.8 07/20/2023 1609   HCT 41.6 07/20/2023 1609   PLT 174.0 07/20/2023 1609   MCV 92.0 07/20/2023 1609   MCH 30.0 05/28/2011 2220   MCHC 33.2 07/20/2023 1609   RDW 13.8 07/20/2023 1609   Iron Studies No results found for: IRON, TIBC, FERRITIN, IRONPCTSAT Lipid Panel     Component Value Date/Time   CHOL 133 07/20/2023 1609   TRIG 93.0 07/20/2023 1609   HDL 49.00 07/20/2023 1609   CHOLHDL 3 07/20/2023 1609   VLDL 18.6 07/20/2023 1609   LDLCALC 66 07/20/2023 1609   Hepatic Function Panel     Component Value Date/Time   PROT 6.9 05/02/2024 0849   ALBUMIN 4.5 05/02/2024 0849   AST 30 05/02/2024 0849   ALT 30 05/02/2024 0849   ALKPHOS 110  05/02/2024 0849   BILITOT 0.7 05/02/2024 0849      Component Value Date/Time   TSH 1.17 07/20/2023 1609   Nutritional Lab Results  Component Value Date   VD25OH 40.4 05/02/2024   VD25OH 45.80 07/20/2023    Medications: Outpatient Encounter Medications as of 05/31/2024  Medication Sig   amLODipine  (NORVASC ) 5 MG tablet Take 1 tablet (5 mg total) by mouth daily.   Ascorbic Acid (VITAMIN C) 1000 MG tablet Take 1,000 mg by mouth daily.   aspirin  (ASPIRIN  CHILDRENS) 81 MG chewable tablet Chew 1 tablet (81 mg total) by mouth daily.   meloxicam  (MOBIC ) 15 MG tablet TAKE 1 TABLET BY MOUTH DAILY (Patient taking differently: Take 15 mg by mouth as needed for pain.)   methocarbamol (ROBAXIN) 750 MG tablet Take 750 mg by mouth every 6 (six) hours as needed for muscle spasms.   Multiple Vitamin (ONE-A-DAY MENS PO) Take by mouth.   rosuvastatin  (CRESTOR ) 10 MG tablet Take 1 tablet (10 mg total) by mouth daily.   tadalafil  (CIALIS ) 5 MG tablet Take 1 tablet (5 mg total) by mouth daily as needed for erectile dysfunction.   telmisartan  (MICARDIS ) 80 MG tablet Take 1 tablet (80 mg total) by mouth daily.   traMADol  (ULTRAM ) 50 MG tablet Take 50 mg by mouth every 6 (six) hours as needed.   [DISCONTINUED] Zinc 50 MG TABS    No facility-administered encounter medications on file as of 05/31/2024.     Follow-Up   No follow-ups on file.SABRA Chad Brooks was informed of the importance of frequent follow up visits to maximize his success with intensive lifestyle modifications for his multiple health conditions.  Attestation Statement   Reviewed by clinician on day of visit: allergies, medications, problem list, medical history, surgical history, family history, social history, and previous encounter notes.     Chad Parker, MD

## 2024-06-01 NOTE — Assessment & Plan Note (Signed)
 LDL is at goal. Elevated LDL may be secondary to nutrition, genetics and spillover effect from excess adiposity. Recommended LDL goal is <70 to reduce the risk of fatty streaks and the progression to obstructive ASCVD in the future.   His 10 year risk is: The 10-year ASCVD risk score (Arnett DK, et al., 2019) is: 4%  Lab Results  Component Value Date   CHOL 133 07/20/2023   HDL 49.00 07/20/2023   LDLCALC 66 07/20/2023   TRIG 93.0 07/20/2023   CHOLHDL 3 07/20/2023   He is on rosuvastatin  10 mg a day  Continue weight loss therapy, losing 10% or more of body weight may improve condition.  Continue statin therapy

## 2024-06-01 NOTE — Assessment & Plan Note (Signed)
 We will review records to determine severity.  We discussed the bidirectional relationship between obesity and OSA in association with other conditions.  Losing 15% of body weight may reduce AHI.

## 2024-06-01 NOTE — Assessment & Plan Note (Signed)
 Blood pressure at goal for age and risk category.  On amlodipine  and telmisartan  without adverse effects.  Check renal parameters today.  Losing 10% of body weight may reduce need of antihypertensives.  Continue with weight loss therapy. Losing 10% may improve blood pressure control. Monitor for symptoms of orthostasis while losing weight. Continue current regimen and home monitoring for a goal blood pressure of 120/80.

## 2024-06-01 NOTE — Assessment & Plan Note (Signed)
 Patient has lost a total of 8 pounds since initiating program bioimpedance information shows reductions in SAT.  There is also improvement in blood pressure control.  He will continue current weight management strategy.  He will be monitoring for orthostasis which would indicate reductions in blood pressure medication.

## 2024-06-21 ENCOUNTER — Encounter (INDEPENDENT_AMBULATORY_CARE_PROVIDER_SITE_OTHER): Payer: Self-pay | Admitting: Internal Medicine

## 2024-06-21 ENCOUNTER — Ambulatory Visit (INDEPENDENT_AMBULATORY_CARE_PROVIDER_SITE_OTHER): Admitting: Internal Medicine

## 2024-06-21 VITALS — BP 117/72 | HR 62 | Temp 98.6°F | Ht 67.0 in | Wt 205.0 lb

## 2024-06-21 DIAGNOSIS — E782 Mixed hyperlipidemia: Secondary | ICD-10-CM | POA: Diagnosis not present

## 2024-06-21 DIAGNOSIS — E66811 Obesity, class 1: Secondary | ICD-10-CM | POA: Diagnosis not present

## 2024-06-21 DIAGNOSIS — G4733 Obstructive sleep apnea (adult) (pediatric): Secondary | ICD-10-CM

## 2024-06-21 DIAGNOSIS — Z6832 Body mass index (BMI) 32.0-32.9, adult: Secondary | ICD-10-CM

## 2024-06-21 DIAGNOSIS — I10 Essential (primary) hypertension: Secondary | ICD-10-CM | POA: Diagnosis not present

## 2024-06-21 NOTE — Progress Notes (Unsigned)
 Office: [phone]  /  Fax: [phone]  Weight Summary and Body Composition Analysis (BIA)  Vitals Temp: 98.6 F (37 C) BP: 117/72 Pulse Rate: 62 SpO2: 96 %   Anthropometric Measurements Height: 5' 7 (1.702 m) Weight: 205 lb (93 kg) BMI (Calculated): 32.1 Weight at Last Visit: 211 lb Weight Lost Since Last Visit: 6 lb Weight Gained Since Last Visit: 0 lb Starting Weight: 219 lb Total Weight Loss (lbs): 14 lb (6.35 kg) Peak Weight: 265 lb   Body Composition  Body Fat %: 32 % Fat Mass (lbs): 65.6 lbs Muscle Mass (lbs): 132.6 lbs Total Body Water (lbs): 105 lbs Visceral Fat Rating : 17    RMR: 1945  Today's Visit #: 4  Starting Date: 05/02/24   Subjective   Chief Complaint: Obesity  Interval History Discussed the use of AI scribe software for clinical note transcription with the patient, who gave verbal consent to proceed.  History of Present Illness   Chad Brooks is a 58 year old male who presents for medical weight management.  Since last office visit he has lost 6 pounds he reports following a 1500-calorie nutrition plan with great adherence.  He is tracking his calories eating more whole foods getting the recommended amount of protein maintaining adequate hydration he is not exercising but gets adequate amount of steps at work.  He has lost 25 pounds since the beginning of the year and a total of 35 pounds from his peak weight of 265 pounds. Previously, he lost weight down to 150 pounds through extreme dieting but now feels satisfied with his current dietary plan, which includes bread, eggs, and milk for breakfast, a sandwich and apple for lunch, and yogurt with frozen cherries and blueberries. He occasionally indulges in bread and butter at restaurants but maintains control over his diet.  He reports feeling better overall with increased energy and agility. His clothing fits more loosely, and he is considering purchasing a new belt. He has good  appetite control and regular bowel movements, although he prefers to have them at home rather than at work. He drinks a 20-ounce bottle of water before leaving the house and four bottles throughout the day, but he is concerned about maintaining his water intake with a busier work schedule. As a bus driver, he strategically plans restroom breaks.  He feels good strength-wise, noting that getting in and out of the truck is easier. His daily routine includes eating a salad every night and sometimes substituting lean meat with lunch meat or a protein shake if necessary. He enjoys fruits like apples, cherries, and blueberries, and occasionally indulges in peanut butter. He is cautious about branching out from his current diet plan, which he feels is working well for him.  He feels good overall with increased energy and agility.      Challenges affecting patient progress: none.    Pharmacotherapy for weight management: He is currently taking no anti-obesity medication.   Assessment and Plan   Treatment Plan For Obesity:  Recommended Dietary Goals  Darell is currently in the action stage of change. As such, his goal is to continue weight management plan. He has agreed to: continue current plan  Behavioral Health and Counseling  We discussed the following behavioral modification strategies today: continue to work on maintaining a reduced calorie state, getting the recommended amount of protein, incorporating whole foods, making healthy choices, staying well hydrated and practicing mindfulness when eating..  Additional education and resources provided today: None  Recommended Physical Activity Goals  Shaya has been advised to work up to 150 minutes of moderate intensity aerobic activity a week and strengthening exercises 2-3 times per week for cardiovascular health, weight loss maintenance and preservation of muscle mass.   He has agreed to :  Think about enjoyable ways to increase daily  physical activity and overcoming barriers to exercise and Increase physical activity in their day and reduce sedentary time (increase NEAT).  Medical Interventions and Pharmacotherapy  We discussed various medication options to help [facility] with his weight loss efforts and we both agreed to : Continue with current nutritional and behavioral strategies  Associated Conditions Impacted by Obesity Treatment  Assessment & Plan Essential hypertension Vitals:   06/21/24 1600  BP: 117/72    Blood pressure is at goal for age and risk category.  On telmisartan  and amlodipine  without adverse effects.  Most recent renal parameters reviewed which showed stable electrolytes and kidney function.  Continue with weight loss therapy. Losing 10% may improve blood pressure control. Monitor for symptoms of orthostasis while losing weight. Continue current regimen and home monitoring for a goal blood pressure of < 120/80.   OSA (obstructive sleep apnea) -reports sleep study in the past, untreated Losing 15% of body weight will reduce AHI continue with current weight management strategy.  He is not interested in GLP-1 Class 1 obesity due to excess calories with serious comorbidity and body mass index (BMI) of 34.0 to 34.9 in adult Weight: decrease of 25.2 lb (10.9%) over 8 months  Start: 10/19/2023 230 lb 3.2 oz (104.4 kg)  End: 06/21/2024 205 lb (93 kg)   He has lost approximately 25 pounds since December 2024, totaling 35 pounds from his peak weight of 265 pounds, indicating significant progress in weight management. He reports increased energy and agility, with looser clothing. He adheres to a structured dietary plan with balanced intake of proteins, fruits, and vegetables, and practices portion control. He maintains regular bowel movements, manages stress well, and is cautious about deviating from his dietary regimen. He understands the importance of hydration, especially given his work conditions, and manages  water intake effectively. He engages in physical activity, performing 15 stand-ups in 30 seconds, indicating good leg strength. - Continue current dietary regimen focusing on balanced intake of proteins, fruits, and vegetables. - Encourage exploration of new healthy recipes and food options to prevent dietary monotony. - Maintain hydration, especially during work hours, to prevent heat exhaustion. - Monitor weight and physical activity levels regularly. - Schedule follow-up appointment in four weeks to assess progress. Mixed hyperlipidemia LDL is at goal. Elevated LDL may be secondary to nutrition, genetics and spillover effect from excess adiposity. Recommended LDL goal is <70 to reduce the risk of fatty streaks and the progression to obstructive ASCVD in the future.   His 10 year risk is: The 10-year ASCVD risk score (Arnett DK, et al., 2019) is: 4.9%  Lab Results  Component Value Date   CHOL 133 07/20/2023   HDL 49.00 07/20/2023   LDLCALC 66 07/20/2023   TRIG 93.0 07/20/2023   CHOLHDL 3 07/20/2023   He is on rosuvastatin  10 mg a day  Continue weight loss therapy, losing 10% or more of body weight may improve condition.  Continue statin therapy He will be due for fasting lipids in the next 2 months if not done outside of the network already.        Objective   Physical Exam:  Blood pressure 117/72, pulse 62, temperature 98.6  F (37 C), height 5' 7 (1.702 m), weight 205 lb (93 kg), SpO2 96%. Body mass index is 32.11 kg/m.  General: He is overweight, cooperative, alert, well developed, and in no acute distress. PSYCH: Has normal mood, affect and thought process.   HEENT: EOMI, sclerae are anicteric. Lungs: Normal breathing effort, no conversational dyspnea. Extremities: No edema.  Neurologic: No gross sensory or motor deficits. No tremors or fasciculations noted.    Diagnostic Data Reviewed:  BMET    Component Value Date/Time   NA 136 05/02/2024 0849   K 4.8  05/02/2024 0849   CL 101 05/02/2024 0849   CO2 19 (L) 05/02/2024 0849   GLUCOSE 94 05/02/2024 0849   GLUCOSE 99 07/20/2023 1609   BUN 28 (H) 05/02/2024 0849   CREATININE 1.21 05/02/2024 0849   CALCIUM  9.4 05/02/2024 0849   Lab Results  Component Value Date   HGBA1C 5.6 05/02/2024   Lab Results  Component Value Date   INSULIN  12.5 05/02/2024   Lab Results  Component Value Date   TSH 1.17 07/20/2023   CBC    Component Value Date/Time   WBC 7.1 07/20/2023 1609   RBC 4.52 07/20/2023 1609   HGB 13.8 07/20/2023 1609   HCT 41.6 07/20/2023 1609   PLT 174.0 07/20/2023 1609   MCV 92.0 07/20/2023 1609   MCH 30.0 05/28/2011 2220   MCHC 33.2 07/20/2023 1609   RDW 13.8 07/20/2023 1609   Iron Studies No results found for: IRON, TIBC, FERRITIN, IRONPCTSAT Lipid Panel     Component Value Date/Time   CHOL 133 07/20/2023 1609   TRIG 93.0 07/20/2023 1609   HDL 49.00 07/20/2023 1609   CHOLHDL 3 07/20/2023 1609   VLDL 18.6 07/20/2023 1609   LDLCALC 66 07/20/2023 1609   Hepatic Function Panel     Component Value Date/Time   PROT 6.9 05/02/2024 0849   ALBUMIN 4.5 05/02/2024 0849   AST 30 05/02/2024 0849   ALT 30 05/02/2024 0849   ALKPHOS 110 05/02/2024 0849   BILITOT 0.7 05/02/2024 0849      Component Value Date/Time   TSH 1.17 07/20/2023 1609   Nutritional Lab Results  Component Value Date   VD25OH 40.4 05/02/2024   VD25OH 45.80 07/20/2023    Medications: Outpatient Encounter Medications as of 06/21/2024  Medication Sig   amLODipine  (NORVASC ) 5 MG tablet Take 1 tablet (5 mg total) by mouth daily.   Ascorbic Acid (VITAMIN C) 1000 MG tablet Take 1,000 mg by mouth daily.   aspirin  (ASPIRIN  CHILDRENS) 81 MG chewable tablet Chew 1 tablet (81 mg total) by mouth daily.   meloxicam  (MOBIC ) 15 MG tablet TAKE 1 TABLET BY MOUTH DAILY (Patient taking differently: Take 15 mg by mouth as needed for pain.)   methocarbamol (ROBAXIN) 750 MG tablet Take 750 mg by mouth every 6  (six) hours as needed for muscle spasms.   Multiple Vitamin (ONE-A-DAY MENS PO) Take by mouth.   rosuvastatin  (CRESTOR ) 10 MG tablet Take 1 tablet (10 mg total) by mouth daily.   tadalafil  (CIALIS ) 5 MG tablet Take 1 tablet (5 mg total) by mouth daily as needed for erectile dysfunction.   telmisartan  (MICARDIS ) 80 MG tablet Take 1 tablet (80 mg total) by mouth daily.   traMADol  (ULTRAM ) 50 MG tablet Take 50 mg by mouth every 6 (six) hours as needed.   No facility-administered encounter medications on file as of 06/21/2024.     Follow-Up   Return in about 4 weeks (around 07/19/2024) for  For Weight Mangement with Dr. Francyne.SABRA He was informed of the importance of frequent follow up visits to maximize his success with intensive lifestyle modifications for his multiple health conditions.  Attestation Statement   Reviewed by clinician on day of visit: allergies, medications, problem list, medical history, surgical history, family history, social history, and previous encounter notes.     Lucas Francyne, MD

## 2024-06-22 NOTE — Assessment & Plan Note (Signed)
 Weight: decrease of 25.2 lb (10.9%) over 8 months  Start: 10/19/2023 230 lb 3.2 oz (104.4 kg)  End: 06/21/2024 205 lb (93 kg)   He has lost approximately 25 pounds since December 2024, totaling 35 pounds from his peak weight of 265 pounds, indicating significant progress in weight management. He reports increased energy and agility, with looser clothing. He adheres to a structured dietary plan with balanced intake of proteins, fruits, and vegetables, and practices portion control. He maintains regular bowel movements, manages stress well, and is cautious about deviating from his dietary regimen. He understands the importance of hydration, especially given his work conditions, and manages water intake effectively. He engages in physical activity, performing 15 stand-ups in 30 seconds, indicating good leg strength. - Continue current dietary regimen focusing on balanced intake of proteins, fruits, and vegetables. - Encourage exploration of new healthy recipes and food options to prevent dietary monotony. - Maintain hydration, especially during work hours, to prevent heat exhaustion. - Monitor weight and physical activity levels regularly. - Schedule follow-up appointment in four weeks to assess progress.

## 2024-06-22 NOTE — Assessment & Plan Note (Signed)
 LDL is at goal. Elevated LDL may be secondary to nutrition, genetics and spillover effect from excess adiposity. Recommended LDL goal is <70 to reduce the risk of fatty streaks and the progression to obstructive ASCVD in the future.   His 10 year risk is: The 10-year ASCVD risk score (Arnett DK, et al., 2019) is: 4.9%  Lab Results  Component Value Date   CHOL 133 07/20/2023   HDL 49.00 07/20/2023   LDLCALC 66 07/20/2023   TRIG 93.0 07/20/2023   CHOLHDL 3 07/20/2023   He is on rosuvastatin  10 mg a day  Continue weight loss therapy, losing 10% or more of body weight may improve condition.  Continue statin therapy

## 2024-06-22 NOTE — Assessment & Plan Note (Signed)
 Losing 15% of body weight will reduce AHI continue with current weight management strategy.  He is not interested in GLP-1

## 2024-06-22 NOTE — Assessment & Plan Note (Signed)
 Vitals:   06/21/24 1600  BP: 117/72    Blood pressure is at goal for age and risk category.  On telmisartan  and amlodipine  without adverse effects.  Most recent renal parameters reviewed which showed stable electrolytes and kidney function.  Continue with weight loss therapy. Losing 10% may improve blood pressure control. Monitor for symptoms of orthostasis while losing weight. Continue current regimen and home monitoring for a goal blood pressure of < 120/80.

## 2024-07-12 ENCOUNTER — Ambulatory Visit (INDEPENDENT_AMBULATORY_CARE_PROVIDER_SITE_OTHER): Admitting: Internal Medicine

## 2024-07-12 ENCOUNTER — Encounter (INDEPENDENT_AMBULATORY_CARE_PROVIDER_SITE_OTHER): Payer: Self-pay | Admitting: Internal Medicine

## 2024-07-12 VITALS — BP 122/75 | HR 63 | Temp 98.1°F | Ht 67.0 in | Wt 200.0 lb

## 2024-07-12 DIAGNOSIS — E66811 Obesity, class 1: Secondary | ICD-10-CM

## 2024-07-12 DIAGNOSIS — G4733 Obstructive sleep apnea (adult) (pediatric): Secondary | ICD-10-CM

## 2024-07-12 DIAGNOSIS — Z6834 Body mass index (BMI) 34.0-34.9, adult: Secondary | ICD-10-CM | POA: Diagnosis not present

## 2024-07-12 DIAGNOSIS — I10 Essential (primary) hypertension: Secondary | ICD-10-CM

## 2024-07-12 NOTE — Progress Notes (Signed)
 Office: [phone]  /  Fax: [phone]  Weight Summary and Body Composition Analysis (BIA)  Vitals Temp: 98.1 F (36.7 C) BP: 122/75 Pulse Rate: 63 SpO2: 98 %   Anthropometric Measurements Height: 5' 7 (1.702 m) Weight: 200 lb (90.7 kg) BMI (Calculated): 31.32 Weight at Last Visit: 205 lb Weight Lost Since Last Visit: 5 lb Weight Gained Since Last Visit: 0 lb Starting Weight: 219 lb Total Weight Loss (lbs): 19 lb (8.618 kg) Peak Weight: 265 lb   Body Composition  Body Fat %: 31.1 % Fat Mass (lbs): 62.4 lbs Muscle Mass (lbs): 131.4 lbs Total Body Water (lbs): 104.6 lbs Visceral Fat Rating : 17    RMR: 1944  Today's Visit #: 5  Starting Date: 05/02/24   Subjective   Chief Complaint: Obesity  Interval History Discussed the use of AI scribe software for clinical note transcription with the patient, who gave verbal consent to proceed.  History of Present Illness Chad Brooks is a 58 year old male who presents for obesity and weight management follow-up.  He has lost 30 pounds since December 2024, with his highest weight being 230 pounds. He reports following a 1500-calorie meal plan and tries to focus on high protein intake. He feels a bit tired of the routine but continues to adhere to it, occasionally allowing himself small indulgences. No feelings of deprivation or restriction by the diet.  He notes adequate satiety and satiation.  His dietary habits include a variety of proteins such as chicken, steak, hamburger, tilapia, and salmon. He also consumes eggs, bread, and sandwiches with [place] lunch meat. He uses a crock pot to prepare chicken breast and hamburger meat for meals.   He prefers certain foods, such as tilapia and salmon, and avoids cottage cheese. He has a fondness for peanut butter, particularly Jif, and is hesitant to try peanut butter powder. His snack preferences include apples, almonds, pistachios, and peanuts.  He maintains a  consistent weight of 203-204 pounds at home and is concerned about potentially losing muscle mass. He acknowledges a tendency to indulge in Chad Brooks on weekends, which he enjoys for the flavor rather than the energy boost.  His wife encourages him to go to the gym, but he feels tired and is considering incorporating some form of exercise, such as a whole-body workout once a week, to help maintain muscle mass.     Challenges affecting patient progress: Lack of routine exercise.    Pharmacotherapy for weight management: He is currently taking no anti-obesity medication.   Assessment and Plan   Treatment Plan For Obesity:  Recommended Dietary Goals  Chad Brooks is currently in the action stage of change. As such, his goal is to continue weight management plan. He has agreed to: follow the Category 3 plan - 1500 kcal per day, continue current plan, and provided with a 7-day Mediterranean option  Behavioral Health and Counseling  We discussed the following behavioral modification strategies today: continue to work on maintaining a reduced calorie state, getting the recommended amount of protein, incorporating whole foods, making healthy choices, staying well hydrated and practicing mindfulness when eating., increase protein intake, fibrous foods (25 grams per day for women, 30 grams for men) and water to improve satiety and decrease hunger signals. , and explore other meal plans for variety.  Additional education and resources provided today: Handout on how to make protein smoothies and Mediterranean meal plan  Recommended Physical Activity Goals  Chad Brooks has been advised to work  up to 150 minutes of moderate intensity aerobic activity a week and strengthening exercises 2-3 times per week for cardiovascular health, weight loss maintenance and preservation of muscle mass.  He has agreed to :  Think about enjoyable ways to increase daily physical activity and overcoming barriers to  exercise and Combine aerobic and strengthening exercises for efficiency and improved cardiometabolic health.  Medical Interventions and Pharmacotherapy  We discussed various medication options to help Chad Brooks with his weight loss efforts and we both agreed to : Continue with current nutritional and behavioral strategies  Associated Conditions Impacted by Obesity Treatment  Assessment & Plan Essential hypertension Vitals:   07/12/24 1600  BP: 122/75    Blood pressure is at goal for age and risk category.  On amlodipine  and Micardis  without adverse effects.  Most recent renal parameters reviewed which showed stable electrolytes and kidney function.  Continue with weight loss therapy. Losing 10% may improve blood pressure control. Monitor for symptoms of orthostasis while losing weight. Continue current regimen and home monitoring for a goal blood pressure of < 120/80.   OSA (obstructive sleep apnea) -reports sleep study in the past, untreated Continue current weight management strategy losing 10 to 15% of body weight may reduce AHI. Class 1 obesity due to excess calories with serious comorbidity and body mass index (BMI) of 34.0 to 34.9 in adult Weight: decrease of 30.2 lb (13.1%) over 8 months, 3 weeks  Start: 10/19/2023 230 lb 3.2 oz (104.4 kg)  End: 07/12/2024 200 lb (90.7 kg)   Chad Brooks continues to lose weight with current weight management strategy we are starting to seeing a decrease in muscle mass over the last 2 months I will like to slow down his weight loss rate by increasing calories by 200 he will add a protein snack in between meals.  We also discussed the importance of maintaining adequate protein intake for him between 90 to 120 g/day and engaging in strengthening exercise.  He gets plenty of nonexercise activity at work but would benefit from strengthening.  Weight loss is occurring at a healthy pace, approximately 4-5 pounds per month, but the rapidity may be contributing to muscle  loss.  - Increase daily caloric intake by 150-200 calories to slow weight loss and monitor muscle mass. - Incorporate strength training exercises - Consider increasing caloric intake to 1600-1700 calories if muscle mass continues to decrease. - Add healthy snacks such as protein, healthy fats and fruit to increase caloric intake. - Encourage the use of protein smoothies and meal replacements for convenience. - Monitor weight and muscle mass at the next appointment.  Recording duration: 29 minutes          Objective   Physical Exam:  Blood pressure 122/75, pulse 63, temperature 98.1 F (36.7 C), height 5' 7 (1.702 m), weight 200 lb (90.7 kg), SpO2 98%. Body mass index is 31.32 kg/m.  General: He is overweight, cooperative, alert, well developed, and in no acute distress. PSYCH: Has normal mood, affect and thought process.   HEENT: EOMI, sclerae are anicteric. Lungs: Normal breathing effort, no conversational dyspnea. Extremities: No edema.  Neurologic: No gross sensory or motor deficits. No tremors or fasciculations noted.    Diagnostic Data Reviewed:  BMET    Component Value Date/Time   NA 136 05/02/2024 0849   K 4.8 05/02/2024 0849   CL 101 05/02/2024 0849   CO2 19 (L) 05/02/2024 0849   GLUCOSE 94 05/02/2024 0849   GLUCOSE 99 07/20/2023 1609   BUN  28 (H) 05/02/2024 0849   CREATININE 1.21 05/02/2024 0849   CALCIUM  9.4 05/02/2024 0849   Lab Results  Component Value Date   HGBA1C 5.6 05/02/2024   Lab Results  Component Value Date   INSULIN  12.5 05/02/2024   Lab Results  Component Value Date   TSH 1.17 07/20/2023   CBC    Component Value Date/Time   WBC 7.1 07/20/2023 1609   RBC 4.52 07/20/2023 1609   HGB 13.8 07/20/2023 1609   HCT 41.6 07/20/2023 1609   PLT 174.0 07/20/2023 1609   MCV 92.0 07/20/2023 1609   MCH 30.0 05/28/2011 2220   MCHC 33.2 07/20/2023 1609   RDW 13.8 07/20/2023 1609   Iron Studies No results found for: IRON, TIBC,  FERRITIN, IRONPCTSAT Lipid Panel     Component Value Date/Time   CHOL 133 07/20/2023 1609   TRIG 93.0 07/20/2023 1609   HDL 49.00 07/20/2023 1609   CHOLHDL 3 07/20/2023 1609   VLDL 18.6 07/20/2023 1609   LDLCALC 66 07/20/2023 1609   Hepatic Function Panel     Component Value Date/Time   PROT 6.9 05/02/2024 0849   ALBUMIN 4.5 05/02/2024 0849   AST 30 05/02/2024 0849   ALT 30 05/02/2024 0849   ALKPHOS 110 05/02/2024 0849   BILITOT 0.7 05/02/2024 0849      Component Value Date/Time   TSH 1.17 07/20/2023 1609   Nutritional Lab Results  Component Value Date   VD25OH 40.4 05/02/2024   VD25OH 45.80 07/20/2023    Medications: Outpatient Encounter Medications as of 07/12/2024  Medication Sig   amLODipine  (NORVASC ) 5 MG tablet Take 1 tablet (5 mg total) by mouth daily.   Ascorbic Acid (VITAMIN C) 1000 MG tablet Take 1,000 mg by mouth daily.   aspirin  (ASPIRIN  CHILDRENS) 81 MG chewable tablet Chew 1 tablet (81 mg total) by mouth daily.   meloxicam  (MOBIC ) 15 MG tablet TAKE 1 TABLET BY MOUTH DAILY (Patient taking differently: Take 15 mg by mouth as needed for pain.)   methocarbamol (ROBAXIN) 750 MG tablet Take 750 mg by mouth every 6 (six) hours as needed for muscle spasms.   Multiple Vitamin (ONE-A-DAY MENS PO) Take by mouth.   rosuvastatin  (CRESTOR ) 10 MG tablet Take 1 tablet (10 mg total) by mouth daily.   tadalafil  (CIALIS ) 5 MG tablet Take 1 tablet (5 mg total) by mouth daily as needed for erectile dysfunction.   telmisartan  (MICARDIS ) 80 MG tablet Take 1 tablet (80 mg total) by mouth daily.   traMADol  (ULTRAM ) 50 MG tablet Take 50 mg by mouth every 6 (six) hours as needed.   No facility-administered encounter medications on file as of 07/12/2024.     Follow-Up   No follow-ups on file.SABRA He was informed of the importance of frequent follow up visits to maximize his success with intensive lifestyle modifications for his multiple health conditions.  Attestation Statement    Reviewed by clinician on day of visit: allergies, medications, problem list, medical history, surgical history, family history, social history, and previous encounter notes.   I have spent 35 minutes in the care of the patient today including: 2 minutes before the visit reviewing and preparing the chart. 29 minutes face-to-face assessing and reviewing listed medical problems as outlined in obesity care plan, providing nutritional and behavioral counseling on topics outlined in the obesity care plan, independently interpreting test results and goals of care, as described in assessment and plan, and reviewing and discussing biometric information and progress 4 minutes after the  visit updating chart and documentation of encounter.    Lucas Parker, MD

## 2024-07-13 NOTE — Assessment & Plan Note (Signed)
 Continue current weight management strategy losing 10 to 15% of body weight may reduce AHI.

## 2024-07-13 NOTE — Assessment & Plan Note (Signed)
 Vitals:   07/12/24 1600  BP: 122/75    Blood pressure is at goal for age and risk category.  On amlodipine  and Micardis  without adverse effects.  Most recent renal parameters reviewed which showed stable electrolytes and kidney function.  Continue with weight loss therapy. Losing 10% may improve blood pressure control. Monitor for symptoms of orthostasis while losing weight. Continue current regimen and home monitoring for a goal blood pressure of < 120/80.

## 2024-07-13 NOTE — Assessment & Plan Note (Signed)
 Weight: decrease of 30.2 lb (13.1%) over 8 months, 3 weeks  Start: 10/19/2023 230 lb 3.2 oz (104.4 kg)  End: 07/12/2024 200 lb (90.7 kg)   Eiden continues to lose weight with current weight management strategy we are starting to seeing a decrease in muscle mass over the last 2 months I will like to slow down his weight loss rate by increasing calories by 200 he will add a protein snack in between meals.  We also discussed the importance of maintaining adequate protein intake for him between 90 to 120 g/day and engaging in strengthening exercise.  He gets plenty of nonexercise activity at work but would benefit from strengthening.  Weight loss is occurring at a healthy pace, approximately 4-5 pounds per month, but the rapidity may be contributing to muscle loss.  - Increase daily caloric intake by 150-200 calories to slow weight loss and monitor muscle mass. - Incorporate strength training exercises - Consider increasing caloric intake to 1600-1700 calories if muscle mass continues to decrease. - Add healthy snacks such as protein, healthy fats and fruit to increase caloric intake. - Encourage the use of protein smoothies and meal replacements for convenience. - Monitor weight and muscle mass at the next appointment.  Recording duration: 29 minutes

## 2024-07-16 ENCOUNTER — Other Ambulatory Visit: Payer: Self-pay | Admitting: Family Medicine

## 2024-07-16 DIAGNOSIS — N401 Enlarged prostate with lower urinary tract symptoms: Secondary | ICD-10-CM

## 2024-08-06 ENCOUNTER — Other Ambulatory Visit: Payer: Self-pay | Admitting: Family Medicine

## 2024-08-06 DIAGNOSIS — S43401A Unspecified sprain of right shoulder joint, initial encounter: Secondary | ICD-10-CM

## 2024-08-09 ENCOUNTER — Ambulatory Visit (INDEPENDENT_AMBULATORY_CARE_PROVIDER_SITE_OTHER): Admitting: Internal Medicine

## 2024-08-09 ENCOUNTER — Encounter (INDEPENDENT_AMBULATORY_CARE_PROVIDER_SITE_OTHER): Payer: Self-pay | Admitting: Internal Medicine

## 2024-08-09 VITALS — BP 122/72 | HR 61 | Temp 98.5°F | Ht 67.0 in | Wt 195.0 lb

## 2024-08-09 DIAGNOSIS — I10 Essential (primary) hypertension: Secondary | ICD-10-CM

## 2024-08-09 DIAGNOSIS — G4733 Obstructive sleep apnea (adult) (pediatric): Secondary | ICD-10-CM | POA: Diagnosis not present

## 2024-08-09 DIAGNOSIS — F419 Anxiety disorder, unspecified: Secondary | ICD-10-CM | POA: Diagnosis not present

## 2024-08-09 DIAGNOSIS — G479 Sleep disorder, unspecified: Secondary | ICD-10-CM | POA: Diagnosis not present

## 2024-08-09 DIAGNOSIS — Z6830 Body mass index (BMI) 30.0-30.9, adult: Secondary | ICD-10-CM

## 2024-08-09 DIAGNOSIS — E66811 Obesity, class 1: Secondary | ICD-10-CM

## 2024-08-09 NOTE — Assessment & Plan Note (Signed)
 Chronic insomnia with difficulty maintaining sleep, possibly exacerbated by anxiety and caffeine intake. Current sleep hygiene practices may be inadequate, with environmental factors such as noise and light contributing to sleep disturbances. - Avoid caffeine after 3 PM - Establish a wind-down routine before bed - Consider environmental modifications such as eye masks and headphones for noise reduction - Practice relaxation techniques and deep breathing exercises - Counseled on sleep hygiene

## 2024-08-09 NOTE — Assessment & Plan Note (Signed)
 Blood pressure is well-controlled at 122/72 mmHg.  On amlodipine  5 mg and telmisartan  80 mg.  No symptoms of dizziness or lightheadedness reported. Previous adjustments in medication were made due to weight changes. - Monitor for orthostasis while losing weight

## 2024-08-09 NOTE — Assessment & Plan Note (Signed)
 Weight: decrease of 35.2 lb (15.3%) over 9 months, 3 weeks  Start: 10/19/2023 230 lb 3.2 oz (104.4 kg)  End: 08/09/2024 195 lb (88.5 kg)  Since starting program Weight: decrease of 28 lb (12.6%) over 2 months, 3 weeks  Start: 05/15/2024 223 lb (101.2 kg)  End: 08/09/2024 195 lb (88.5 kg)   Continue 1500-calorie nutrition plan. Work on sleep hygiene He has high levels of nonexercise activity at work Encouraged to do additional exercise throughout the week

## 2024-08-09 NOTE — Assessment & Plan Note (Signed)
 Anxiety appears to be contributing to insomnia, with stressors including work-related concerns and home remodeling. Caffeine intake may exacerbate anxiety symptoms. - Schedule dedicated worry time in the evening - Practice relaxation techniques, handout provided. - Monitor caffeine intake and reduce as needed

## 2024-08-09 NOTE — Progress Notes (Signed)
 Office: [phone]  /  Fax: [phone]  Weight Summary and Body Composition Analysis (BIA)  Vitals Temp: 98.5 F (36.9 C) BP: 122/72 Pulse Rate: 61 SpO2: 98 %   Anthropometric Measurements Height: 5' 7 (1.702 m) Weight: 195 lb (88.5 kg) BMI (Calculated): 30.53 Weight at Last Visit: 200 lb Weight Lost Since Last Visit: 5 lb Weight Gained Since Last Visit: 0 lb Starting Weight: 219 lb Total Weight Loss (lbs): 24 lb (10.9 kg) Peak Weight: 265 lb   Body Composition  Body Fat %: 30.5 % Fat Mass (lbs): 59.8 lbs Muscle Mass (lbs): 129.2 lbs Total Body Water (lbs): 102 lbs Visceral Fat Rating : 16    RMR: 1944  Today's Visit #: 6  Starting Date: 05/02/24   Subjective   Chief Complaint: Obesity  Interval History Discussed the use of AI scribe software for clinical note transcription with the patient, who gave verbal consent to proceed.  History of Present Illness Chad Brooks is a 58 year old male with obesity, OSA, and hypertension who presents for medical weight management.  Since last office visit he has lost 5 pounds.  He is following a 1500-calorie nutrition plan 75% of the time he has been eating more whole foods getting the recommended amount of protein maintaining adequate hydration occasionally may skip a meal he reports an adequate sleep and has not been exercising.  He does stay physically active at work  Since his last visit, he has lost five pounds, contributing to a total weight loss of thirty-five pounds over the past nine months. His waist size has decreased, and he has noticed his pants becoming loose, though he has not yet purchased new clothing due to the upcoming winter season.  His current diet includes a large salad with spring mix, tomatoes, onion, celery, and broccoli for dinner, along with a protein source. He continues to have a sandwich and yogurt for lunch, and eggs and toast with milk for breakfast. He has incorporated almonds as a  snack, consuming a handful without counting them. On weekends, he allows himself more dietary flexibility, including eating peanut butter, popcorn, and [employer] cups.  He experiences ongoing sleep disturbances, characterized by waking up at 1:00 AM and difficulty returning to sleep. He attributes this to anxiety related to his job and home remodeling. He often gets up to eat breakfast early and naps during the day. He has not always had sleep issues, but they have been present since starting his current job. He consumes coffee, sometimes at night, and energy drinks on weekends, which may contribute to his sleep difficulties.  No lightheadedness or dizziness is reported.     Challenges affecting patient progress: lack of strengthening exercise.    Pharmacotherapy for weight management: He is currently taking no anti-obesity medication.   Assessment and Plan   Treatment Plan For Obesity:  Recommended Dietary Goals  Chad Brooks is currently in the action stage of change. As such, his goal is to continue weight management plan. He has agreed to: continue current plan  Behavioral Health and Counseling  We discussed the following behavioral modification strategies today: continue to work on maintaining a reduced calorie state, getting the recommended amount of protein, incorporating whole foods, making healthy choices, staying well hydrated and practicing mindfulness when eating. and increase protein intake, fibrous foods (25 grams per day for women, 30 grams for men) and water to improve satiety and decrease hunger signals. .  Additional education and resources provided today: None  Recommended  Physical Activity Goals  Chad Brooks has been advised to work up to 150 minutes of moderate intensity aerobic activity a week and strengthening exercises 2-3 times per week for cardiovascular health, weight loss maintenance and preservation of muscle mass.  He has agreed to :  Think about enjoyable ways to  increase daily physical activity and overcoming barriers to exercise, Increase volume of physical activity to a goal of 240 minutes a week, and Combine aerobic and strengthening exercises for efficiency and improved cardiometabolic health.  Medical Interventions and Pharmacotherapy  We discussed various medication options to help [facility] with his weight loss efforts and we both agreed to : Continue with current nutritional and behavioral strategies  Associated Conditions Impacted by Obesity Treatment  Assessment & Plan Class 1 obesity with serious comorbidity and body mass index (BMI) of 30.0 to 30.9 in adult, unspecified obesity type Weight: decrease of 35.2 lb (15.3%) over 9 months, 3 weeks  Start: 10/19/2023 230 lb 3.2 oz (104.4 kg)  End: 08/09/2024 195 lb (88.5 kg)  Since starting program Weight: decrease of 28 lb (12.6%) over 2 months, 3 weeks  Start: 05/15/2024 223 lb (101.2 kg)  End: 08/09/2024 195 lb (88.5 kg)   Continue 1500-calorie nutrition plan. Work on sleep hygiene He has high levels of nonexercise activity at work Encouraged to do additional exercise throughout the week Essential hypertension Blood pressure is well-controlled at 122/72 mmHg.  On amlodipine  5 mg and telmisartan  80 mg.  No symptoms of dizziness or lightheadedness reported. Previous adjustments in medication were made due to weight changes. - Monitor for orthostasis while losing weight OSA (obstructive sleep apnea) -reports sleep study in the past, untreated Untreated he has lost approximately 15% of total body weight which may have improved symptoms.  He is having some difficulties with staying asleep and feeling tired during the day may be worthwhile repeating sleep study but I think he had problems with PAP therapy in the past. Sleep difficulties Chronic insomnia with difficulty maintaining sleep, possibly exacerbated by anxiety and caffeine intake. Current sleep hygiene practices may be inadequate, with  environmental factors such as noise and light contributing to sleep disturbances. - Avoid caffeine after 3 PM - Establish a wind-down routine before bed - Consider environmental modifications such as eye masks and headphones for noise reduction - Practice relaxation techniques and deep breathing exercises - Counseled on sleep hygiene Anxiety Anxiety appears to be contributing to insomnia, with stressors including work-related concerns and home remodeling. Caffeine intake may exacerbate anxiety symptoms. - Schedule dedicated worry time in the evening - Practice relaxation techniques, handout provided. - Monitor caffeine intake and reduce as needed      Objective   Physical Exam:  Blood pressure 122/72, pulse 61, temperature 98.5 F (36.9 C), height 5' 7 (1.702 m), weight 195 lb (88.5 kg), SpO2 98%. Body mass index is 30.54 kg/m.  General: He is overweight, cooperative, alert, well developed, and in no acute distress. PSYCH: Has normal mood, affect and thought process.   HEENT: EOMI, sclerae are anicteric. Lungs: Normal breathing effort, no conversational dyspnea. Extremities: No edema.  Neurologic: No gross sensory or motor deficits. No tremors or fasciculations noted.    Diagnostic Data Reviewed:  BMET    Component Value Date/Time   NA 136 05/02/2024 0849   K 4.8 05/02/2024 0849   CL 101 05/02/2024 0849   CO2 19 (L) 05/02/2024 0849   GLUCOSE 94 05/02/2024 0849   GLUCOSE 99 07/20/2023 1609   BUN 28 (H) 05/02/2024  0849   CREATININE 1.21 05/02/2024 0849   CALCIUM  9.4 05/02/2024 0849   Lab Results  Component Value Date   HGBA1C 5.6 05/02/2024   Lab Results  Component Value Date   INSULIN  12.5 05/02/2024   Lab Results  Component Value Date   TSH 1.17 07/20/2023   CBC    Component Value Date/Time   WBC 7.1 07/20/2023 1609   RBC 4.52 07/20/2023 1609   HGB 13.8 07/20/2023 1609   HCT 41.6 07/20/2023 1609   PLT 174.0 07/20/2023 1609   MCV 92.0 07/20/2023 1609    MCH 30.0 05/28/2011 2220   MCHC 33.2 07/20/2023 1609   RDW 13.8 07/20/2023 1609   Iron Studies No results found for: IRON, TIBC, FERRITIN, IRONPCTSAT Lipid Panel     Component Value Date/Time   CHOL 133 07/20/2023 1609   TRIG 93.0 07/20/2023 1609   HDL 49.00 07/20/2023 1609   CHOLHDL 3 07/20/2023 1609   VLDL 18.6 07/20/2023 1609   LDLCALC 66 07/20/2023 1609   Hepatic Function Panel     Component Value Date/Time   PROT 6.9 05/02/2024 0849   ALBUMIN 4.5 05/02/2024 0849   AST 30 05/02/2024 0849   ALT 30 05/02/2024 0849   ALKPHOS 110 05/02/2024 0849   BILITOT 0.7 05/02/2024 0849      Component Value Date/Time   TSH 1.17 07/20/2023 1609   Nutritional Lab Results  Component Value Date   VD25OH 40.4 05/02/2024   VD25OH 45.80 07/20/2023    Medications: Outpatient Encounter Medications as of 08/09/2024  Medication Sig   amLODipine  (NORVASC ) 5 MG tablet Take 1 tablet (5 mg total) by mouth daily.   Ascorbic Acid (VITAMIN C) 1000 MG tablet Take 1,000 mg by mouth daily.   aspirin  (ASPIRIN  CHILDRENS) 81 MG chewable tablet Chew 1 tablet (81 mg total) by mouth daily.   meloxicam  (MOBIC ) 15 MG tablet Take 1 tablet (15 mg total) by mouth as needed for pain.   methocarbamol (ROBAXIN) 750 MG tablet Take 750 mg by mouth every 6 (six) hours as needed for muscle spasms.   Multiple Vitamin (ONE-A-DAY MENS PO) Take by mouth.   rosuvastatin  (CRESTOR ) 10 MG tablet Take 1 tablet (10 mg total) by mouth daily.   tadalafil  (CIALIS ) 5 MG tablet TAKE 1 TABLET BY MOUTH DAILY AS NEEDED FOR ERECTILE DYSFUNCTION   telmisartan  (MICARDIS ) 80 MG tablet Take 1 tablet (80 mg total) by mouth daily.   traMADol  (ULTRAM ) 50 MG tablet Take 50 mg by mouth every 6 (six) hours as needed.   No facility-administered encounter medications on file as of 08/09/2024.     Follow-Up   Return in about 4 weeks (around 09/06/2024) for For Weight Mangement with Dr. Francyne.SABRA He was informed of the importance of  frequent follow up visits to maximize his success with intensive lifestyle modifications for his multiple health conditions.  Attestation Statement   Reviewed by clinician on day of visit: allergies, medications, problem list, medical history, surgical history, family history, social history, and previous encounter notes.     Lucas Francyne, MD

## 2024-08-09 NOTE — Assessment & Plan Note (Signed)
 Untreated he has lost approximately 15% of total body weight which may have improved symptoms.  He is having some difficulties with staying asleep and feeling tired during the day may be worthwhile repeating sleep study but I think he had problems with PAP therapy in the past.

## 2024-08-15 ENCOUNTER — Encounter: Payer: Self-pay | Admitting: Family Medicine

## 2024-08-15 ENCOUNTER — Ambulatory Visit: Admitting: Family Medicine

## 2024-08-15 VITALS — BP 122/70 | HR 58 | Temp 97.2°F | Ht 67.0 in | Wt 201.8 lb

## 2024-08-15 DIAGNOSIS — Z6830 Body mass index (BMI) 30.0-30.9, adult: Secondary | ICD-10-CM

## 2024-08-15 DIAGNOSIS — E66811 Obesity, class 1: Secondary | ICD-10-CM

## 2024-08-15 DIAGNOSIS — F419 Anxiety disorder, unspecified: Secondary | ICD-10-CM

## 2024-08-15 DIAGNOSIS — I10 Essential (primary) hypertension: Secondary | ICD-10-CM

## 2024-08-15 DIAGNOSIS — E782 Mixed hyperlipidemia: Secondary | ICD-10-CM | POA: Diagnosis not present

## 2024-08-15 MED ORDER — METHOCARBAMOL 750 MG PO TABS: 750.0000 mg | ORAL_TABLET | Freq: Four times a day (QID) | ORAL | 0 refills | Status: DC | PRN

## 2024-08-15 NOTE — Assessment & Plan Note (Signed)
 Anxiety appears to be contributing to insomnia, with stressors including work-related concerns and home remodeling. Discussed that CBT has the best long-term outcomes for insomnia.

## 2024-08-15 NOTE — Assessment & Plan Note (Signed)
 LDL cholesterol is at goal. Continue rosuvastatin 10 mg daily.

## 2024-08-15 NOTE — Assessment & Plan Note (Signed)
 Maximum weight: 230 lbs (10/2023) Current weight: 201 lbs Weight change since last visit: - 21 lbs Total weight loss: -29 lbs (12.6 %)  Congratulated Chad Brooks on his success with weight loss so far. Encourage him to continue his current efforts. Recommend he start to include some regular exercise.

## 2024-08-15 NOTE — Assessment & Plan Note (Signed)
 Blood pressure is in good control. Continue amlodipine  5 mg and telmisartan  80 mg daily. Discussed possibly stopping his amlodipine  when his weight gets under 200 lbs.

## 2024-08-15 NOTE — Progress Notes (Signed)
 [facility] [address] [place] [address] Dept: [phone] Dept Fax: [phone]  Chronic Care Office Visit  Subjective:    Patient ID: Chad Brooks, male    DOB: [birth year], 58 y.o..   MRN: [id number]  Chief Complaint  Patient presents with   Hypertension    3 month follow up for HTN. Declined Flu Shot and Pneumonia Vac. No other concerns.   History of Present Illness:  Patient is in today for reassessment of chronic medical issues.  Chad Brooks has a history of hypertension. He is managed on amlodipine  5 mg daily and telmisartan  80 mg daily.   Chad Brooks has a history of hyperlipidemia. He is managed on rosuvastatin  10 mg daily.   Chad Brooks is engaged with [facility] program. He is following a calorie controlled, high protein diet. He has been pleased with the weight loss he has experienced so far. He admits that he is not exercising regularly, but remains very active with work.  Chad Brooks has ongoing issues with sleep. He typically goes to bed around 8:00 om. He will be awake by 1:30 am. He will get up, prep things for work and then try and nap in his recliner. He does sleep later on weekend days. He tried using Nyquil Zzzs, but did not like how draggy he felt the next day.  Past Medical History: Patient Active Problem List   Diagnosis Date Noted   Sleep difficulties 08/09/2024   Anxiety 08/09/2024   Tobacco dependence in remission 05/15/2024   OSA (obstructive sleep apnea) -reports sleep study in the past, untreated 05/02/2024   Class 1 obesity with serious comorbidity and body mass index (BMI) of 30.0 to 30.9 in adult 05/02/2024   Aortic atherosclerosis 08/19/2023   Coronary atherosclerosis 08/19/2023   Emphysema of lung (HCC) 08/19/2023   Essential hypertension 07/20/2023   Arthritis of carpometacarpal (CMC) joint of both thumbs 07/20/2023   Hyperlipidemia 07/20/2023   Benign prostatic  hyperplasia 07/20/2023   Fuchs' corneal dystrophy 07/20/2023   History of rheumatic fever as a child 07/20/2023   Past Surgical History:  Procedure Laterality Date   ANTERIOR CRUCIATE LIGAMENT REPAIR     BUNIONECTOMY Right    FRACTURE SURGERY  12/09/2016   Left humerus repair   HUMERUS FRACTURE SURGERY Left 12/09/2016   Left humerus repair   KNEE ARTHROSCOPY Left 1993   ROTATOR CUFF REPAIR Right    x 2   Family History  Problem Relation Age of Onset   COPD Mother    Heart disease Mother    Miscarriages / [place] Mother    Vision loss Mother    Hypertension Mother    Hyperlipidemia Mother    Hearing loss Father    Peripheral Artery Disease Father    Cancer Sister        Breast   Birth defects Brother    Diabetes Maternal Grandmother    Heart disease Maternal Grandmother    Heart disease Paternal Uncle    Outpatient Medications Prior to Visit  Medication Sig Dispense Refill   amLODipine  (NORVASC ) 5 MG tablet Take 1 tablet (5 mg total) by mouth daily. 90 tablet 3   Ascorbic Acid (VITAMIN C) 1000 MG tablet Take 1,000 mg by mouth daily.     aspirin  (ASPIRIN  CHILDRENS) 81 MG chewable tablet Chew 1 tablet (81 mg total) by mouth daily.     meloxicam  (MOBIC ) 15 MG tablet Take 1 tablet (15 mg  total) by mouth as needed for pain. 30 tablet 2   Multiple Vitamin (ONE-A-DAY MENS PO) Take by mouth.     rosuvastatin  (CRESTOR ) 10 MG tablet Take 1 tablet (10 mg total) by mouth daily. 90 tablet 3   tadalafil  (CIALIS ) 5 MG tablet TAKE 1 TABLET BY MOUTH DAILY AS NEEDED FOR ERECTILE DYSFUNCTION 90 tablet 0   telmisartan  (MICARDIS ) 80 MG tablet Take 1 tablet (80 mg total) by mouth daily. 90 tablet 3   methocarbamol (ROBAXIN) 750 MG tablet Take 750 mg by mouth every 6 (six) hours as needed for muscle spasms.     traMADol  (ULTRAM ) 50 MG tablet Take 50 mg by mouth every 6 (six) hours as needed. (Patient not taking: Reported on 08/15/2024)     No facility-administered medications prior to  visit.   No Known Allergies Objective:   Today's Vitals   08/15/24 1542  BP: 122/70  Pulse: (!) 58  Temp: (!) 97.2 F (36.2 C)  TempSrc: Temporal  SpO2: 97%  Weight: 201 lb 12.8 oz (91.5 kg)  Height: 5' 7 (1.702 m)   Body mass index is 31.61 kg/m.   General: Well developed, well nourished. No acute distress. Psych: Alert and oriented. Normal mood and affect.  Health Maintenance Due  Topic Date Due   HIV Screening  Never done   Hepatitis B Vaccines 19-59 Average Risk (1 of 3 - 19+ 3-dose series) Never done   Lung Cancer Screening  08/03/2024     Assessment & Plan:   Problem List Items Addressed This Visit       Cardiovascular and Mediastinum   Essential hypertension - Primary   Blood pressure is in good control. Continue amlodipine  5 mg and telmisartan  80 mg daily. Discussed possibly stopping his amlodipine  when his weight gets under 200 lbs.        Other   Anxiety   Anxiety appears to be contributing to insomnia, with stressors including work-related concerns and home remodeling. Discussed that CBT has the best long-term outcomes for insomnia.      Class 1 obesity with serious comorbidity and body mass index (BMI) of 30.0 to 30.9 in adult   Maximum weight: 230 lbs (10/2023) Current weight: 201 lbs Weight change since last visit: - 21 lbs Total weight loss: -29 lbs (12.6 %)  Congratulated Chad Brooks on his success with weight loss so far. Encourage him to continue his current efforts. Recommend he start to include some regular exercise.      Hyperlipidemia   LDL cholesterol is at goal. Continue rosuvastatin  10 mg daily.       Return in about 3 months (around 11/15/2024) for Reassessment.   [name], MD

## 2024-09-10 ENCOUNTER — Ambulatory Visit (INDEPENDENT_AMBULATORY_CARE_PROVIDER_SITE_OTHER): Payer: Self-pay | Admitting: Internal Medicine

## 2024-09-10 ENCOUNTER — Encounter (INDEPENDENT_AMBULATORY_CARE_PROVIDER_SITE_OTHER): Payer: Self-pay | Admitting: Internal Medicine

## 2024-09-10 VITALS — BP 136/76 | HR 69 | Temp 97.7°F | Ht 67.0 in | Wt 191.0 lb

## 2024-09-10 DIAGNOSIS — Z6829 Body mass index (BMI) 29.0-29.9, adult: Secondary | ICD-10-CM | POA: Diagnosis not present

## 2024-09-10 DIAGNOSIS — I10 Essential (primary) hypertension: Secondary | ICD-10-CM | POA: Diagnosis not present

## 2024-09-10 DIAGNOSIS — G4733 Obstructive sleep apnea (adult) (pediatric): Secondary | ICD-10-CM

## 2024-09-10 DIAGNOSIS — E669 Obesity, unspecified: Secondary | ICD-10-CM

## 2024-09-10 NOTE — Progress Notes (Unsigned)
 Office: [phone]  /  Fax: [phone]  Weight Summary and Body Composition Analysis (BIA)  Vitals Temp: 97.7 F (36.5 C) BP: 136/76 Pulse Rate: 69 SpO2: 99 %   Anthropometric Measurements Height: 5' 7 (1.702 m) Weight: 191 lb (86.6 kg) BMI (Calculated): 29.91 Weight at Last Visit: 195 lb Weight Lost Since Last Visit: 4 lb Weight Gained Since Last Visit: 0 lb Starting Weight: 219 lb Total Weight Loss (lbs): 28 lb (12.7 kg) Peak Weight: 265 lb   Body Composition  Body Fat %: 28.3 % Fat Mass (lbs): 54.2 lbs Muscle Mass (lbs): 130.2 lbs Total Body Water (lbs): 95.2 lbs Visceral Fat Rating : 15    RMR: 1944  Today's Visit #: 7  Starting Date: 05/02/24   Subjective   Chief Complaint: Obesity  Interval History Discussed the use of AI scribe software for clinical note transcription with the patient, who gave verbal consent to proceed.  History of Present Illness Chad Brooks is a 58 year old male who presents for medical weight management.  He is following a 1500-calorie nutrition plan 75% of the time.  He has not been tracking reports eating more whole food getting the recommended amount of protein maintaining adequate hydration has not been exercising.  He has lost four pounds since his last visit, with a decrease in body fat percentage from 34% to 28% and visceral fat from 19 to 15. His BMI has decreased from 35 to 29. He maintains adequate hydration and protein intake.  He experiences poor sleep, waking frequently at night to urinate, approximately every hour. External factors such as his dog and his wife watching TV contribute to his sleep disturbances. He sleeps more on weekends and feels tired during the day, sometimes nodding off. He has a history of sleep apnea and difficulty using a CPAP machine. His wife has noticed his snoring, but no episodes of apnea. He was diagnosed with sleep apnea approximately 30 years ago.  He experiences increased hunger  recently, attributing it to boredom and reduced activity at work. He consumes more peanut butter and occasionally snacks on peanut butter nutty bars. He finds it challenging to maintain a healthy diet due to the cost of healthy foods compared to cheaper options.  He has a history of frequent nighttime urination since around 2016, which he associates with drinking water throughout the day. No alcohol or caffeine consumption before bed and he limits fluid intake in the evening.     Challenges affecting patient progress: Lack of exercise but adequate amount of and NEAT.    Pharmacotherapy for weight management: He is currently taking no anti-obesity medication.   Assessment and Plan   Treatment Plan For Obesity:  Recommended Dietary Goals  Chad Brooks is currently in the action stage of change. As such, his goal is to continue weight management plan. He has agreed to: continue current plan  Behavioral Health and Counseling  We discussed the following behavioral modification strategies today: continue to practice mindfulness when eating, continue to work on maintaining a reduced calorie state, getting the recommended amount of protein, incorporating whole foods, making healthy choices, staying well hydrated and practicing mindfulness when eating., and increase protein intake, fibrous foods (25 grams per day for women, 30 grams for men) and water to improve satiety and decrease hunger signals. .  Additional education and resources provided today: Handout on traveling and holiday eating strategies  Recommended Physical Activity Goals  Chad Brooks has been advised to work up to 150 minutes  of moderate intensity aerobic activity a week and strengthening exercises 2-3 times per week for cardiovascular health, weight loss maintenance and preservation of muscle mass.  He has agreed to :  Think about enjoyable ways to increase daily physical activity and overcoming barriers to exercise, Increase volume of  physical activity to a goal of 240 minutes a week, and Combine aerobic and strengthening exercises for efficiency and improved cardiometabolic health.  Medical Interventions and Pharmacotherapy  We discussed various medication options to help [name] with his weight loss efforts and we both agreed to : Continue with current nutritional and behavioral strategies  Associated Conditions Impacted by Obesity Treatment  Assessment & Plan Essential hypertension Vitals:   09/10/24 1500  BP: 136/76    Blood pressure is at goal for age and risk category.  On amlodipine  and telmisartan  without adverse effects.  Most recent renal parameters reviewed which showed stable electrolytes and kidney function.  Continue current regimen.  Continue current weight management strategy  Generalized obesity -with a starting BMI of 34 BMI 29.0-29.9,adult Weight: decrease of 39.2 lb (17%) over 10 months, 3 weeks  Start: 10/19/2023 230 lb 3.2 oz (104.4 kg)  End: 09/10/2024 191 lb (86.6 kg)  See obesity treatment plan OSA (obstructive sleep apnea) -reports sleep study in the past, untreated Sleep disturbance and nocturia, likely related to suspected obstructive sleep apnea Reports frequent nocturia and daytime fatigue. Possible obstructive sleep apnea suggested by snoring and daytime sleepiness. Previous sleep apnea diagnosis with unsuccessful CPAP use. Discussed potential benefits of using a foam wedge to reduce nocturia and improve sleep quality. Advised on reducing fluid intake before bedtime and elevating the head of the bed to prevent fluid return to the heart. - Consider using a foam wedge to elevate the head of the bed and reduce nocturia. - Advised reducing fluid intake two hours before bedtime. - Encouraged discussion with spouse about snoring and potential improvement with weight loss.        Objective   Physical Exam:  Blood pressure 136/76, pulse 69, temperature 97.7 F (36.5 C), height 5' 7  (1.702 m), weight 191 lb (86.6 kg), SpO2 99%. Body mass index is 29.91 kg/m.  General: He is overweight, cooperative, alert, well developed, and in no acute distress. PSYCH: Has normal mood, affect and thought process.   HEENT: EOMI, sclerae are anicteric. Lungs: Normal breathing effort, no conversational dyspnea. Extremities: No edema.  Neurologic: No gross sensory or motor deficits. No tremors or fasciculations noted.    Diagnostic Data Reviewed:  BMET    Component Value Date/Time   NA 136 05/02/2024 0849   K 4.8 05/02/2024 0849   CL 101 05/02/2024 0849   CO2 19 (L) 05/02/2024 0849   GLUCOSE 94 05/02/2024 0849   GLUCOSE 99 07/20/2023 1609   BUN 28 (H) 05/02/2024 0849   CREATININE 1.21 05/02/2024 0849   CALCIUM  9.4 05/02/2024 0849   Lab Results  Component Value Date   HGBA1C 5.6 05/02/2024   Lab Results  Component Value Date   INSULIN  12.5 05/02/2024   Lab Results  Component Value Date   TSH 1.17 07/20/2023   CBC    Component Value Date/Time   WBC 7.1 07/20/2023 1609   RBC 4.52 07/20/2023 1609   HGB 13.8 07/20/2023 1609   HCT 41.6 07/20/2023 1609   PLT 174.0 07/20/2023 1609   MCV 92.0 07/20/2023 1609   MCH 30.0 05/28/2011 2220   MCHC 33.2 07/20/2023 1609   RDW 13.8 07/20/2023 1609  Iron Studies No results found for: IRON, TIBC, FERRITIN, IRONPCTSAT Lipid Panel     Component Value Date/Time   CHOL 133 07/20/2023 1609   TRIG 93.0 07/20/2023 1609   HDL 49.00 07/20/2023 1609   CHOLHDL 3 07/20/2023 1609   VLDL 18.6 07/20/2023 1609   LDLCALC 66 07/20/2023 1609   Hepatic Function Panel     Component Value Date/Time   PROT 6.9 05/02/2024 0849   ALBUMIN 4.5 05/02/2024 0849   AST 30 05/02/2024 0849   ALT 30 05/02/2024 0849   ALKPHOS 110 05/02/2024 0849   BILITOT 0.7 05/02/2024 0849      Component Value Date/Time   TSH 1.17 07/20/2023 1609   Nutritional Lab Results  Component Value Date   VD25OH 40.4 05/02/2024   VD25OH 45.80 07/20/2023     Medications: Outpatient Encounter Medications as of 09/10/2024  Medication Sig   amLODipine  (NORVASC ) 5 MG tablet Take 1 tablet (5 mg total) by mouth daily.   Ascorbic Acid (VITAMIN C) 1000 MG tablet Take 1,000 mg by mouth daily.   aspirin  (ASPIRIN  CHILDRENS) 81 MG chewable tablet Chew 1 tablet (81 mg total) by mouth daily.   meloxicam  (MOBIC ) 15 MG tablet Take 1 tablet (15 mg total) by mouth as needed for pain.   methocarbamol (ROBAXIN) 750 MG tablet Take 1 tablet (750 mg total) by mouth every 6 (six) hours as needed for muscle spasms.   Multiple Vitamin (ONE-A-DAY MENS PO) Take by mouth.   rosuvastatin  (CRESTOR ) 10 MG tablet Take 1 tablet (10 mg total) by mouth daily.   tadalafil  (CIALIS ) 5 MG tablet TAKE 1 TABLET BY MOUTH DAILY AS NEEDED FOR ERECTILE DYSFUNCTION   telmisartan  (MICARDIS ) 80 MG tablet Take 1 tablet (80 mg total) by mouth daily.   No facility-administered encounter medications on file as of 09/10/2024.     Follow-Up   Return in about 4 weeks (around 10/08/2024) for For Weight Mangement with Dr. Francyne.SABRA He was informed of the importance of frequent follow up visits to maximize his success with intensive lifestyle modifications for his multiple health conditions.  Attestation Statement   Reviewed by clinician on day of visit: allergies, medications, problem list, medical history, surgical history, family history, social history, and previous encounter notes.     Lucas Francyne, MD

## 2024-09-11 DIAGNOSIS — E669 Obesity, unspecified: Secondary | ICD-10-CM | POA: Insufficient documentation

## 2024-09-11 NOTE — Assessment & Plan Note (Signed)
 Sleep disturbance and nocturia, likely related to suspected obstructive sleep apnea Reports frequent nocturia and daytime fatigue. Possible obstructive sleep apnea suggested by snoring and daytime sleepiness. Previous sleep apnea diagnosis with unsuccessful CPAP use. Discussed potential benefits of using a foam wedge to reduce nocturia and improve sleep quality. Advised on reducing fluid intake before bedtime and elevating the head of the bed to prevent fluid return to the heart. - Consider using a foam wedge to elevate the head of the bed and reduce nocturia. - Advised reducing fluid intake two hours before bedtime. - Encouraged discussion with spouse about snoring and potential improvement with weight loss.

## 2024-09-11 NOTE — Assessment & Plan Note (Signed)
 Vitals:   09/10/24 1500  BP: 136/76    Blood pressure is at goal for age and risk category.  On amlodipine  and telmisartan  without adverse effects.  Most recent renal parameters reviewed which showed stable electrolytes and kidney function.  Continue current regimen.  Continue current weight management strategy

## 2024-09-11 NOTE — Assessment & Plan Note (Signed)
 Weight: decrease of 39.2 lb (17%) over 10 months, 3 weeks  Start: 10/19/2023 230 lb 3.2 oz (104.4 kg)  End: 09/10/2024 191 lb (86.6 kg)  See obesity treatment plan

## 2024-10-09 ENCOUNTER — Encounter (INDEPENDENT_AMBULATORY_CARE_PROVIDER_SITE_OTHER): Payer: Self-pay | Admitting: Internal Medicine

## 2024-10-09 ENCOUNTER — Ambulatory Visit (INDEPENDENT_AMBULATORY_CARE_PROVIDER_SITE_OTHER): Payer: Self-pay | Admitting: Internal Medicine

## 2024-10-09 VITALS — BP 139/82 | HR 56 | Temp 98.3°F | Ht 67.0 in | Wt 191.0 lb

## 2024-10-09 DIAGNOSIS — F419 Anxiety disorder, unspecified: Secondary | ICD-10-CM

## 2024-10-09 DIAGNOSIS — Z6830 Body mass index (BMI) 30.0-30.9, adult: Secondary | ICD-10-CM

## 2024-10-09 DIAGNOSIS — G4733 Obstructive sleep apnea (adult) (pediatric): Secondary | ICD-10-CM

## 2024-10-09 DIAGNOSIS — F5104 Psychophysiologic insomnia: Secondary | ICD-10-CM | POA: Insufficient documentation

## 2024-10-09 MED ORDER — ESCITALOPRAM OXALATE 10 MG PO TABS: 10.0000 mg | ORAL_TABLET | Freq: Every day | ORAL | 0 refills | Status: DC

## 2024-10-09 NOTE — Assessment & Plan Note (Signed)
 Likely related to generalized anxiety disorder. Difficulty falling asleep due to racing thoughts and anxiety about the next day. Discussed strategies to manage anxiety and improve sleep, including the use of Lexapro  and cognitive behavioral techniques. - Implement cognitive behavioral techniques for insomnia, including dedicated worry time after dinner. - Use Lexapro  as prescribed to help manage anxiety and improve sleep.

## 2024-10-09 NOTE — Assessment & Plan Note (Signed)
 He has lost a significant amount of weight over time we will consider doing an overnight oxygen test in January to see if there is some nighttime hypoxemia indicative of possibly ongoing disordered sleep breathing.  This may also affect sleep maintenance and weight.  It may certainly contribute to some of his daytime somnolence and fatigue.

## 2024-10-09 NOTE — Assessment & Plan Note (Signed)
 Weight: decrease of 39.2 lb (17%) over 11 months, 3 weeks  Start: 10/19/2023 230 lb 3.2 oz (104.4 kg)  End: 10/09/2024 191 lb (86.6 kg)  Management is ongoing with a focus on maintaining weight during the holiday season. Recent indulgence in chocolate and other treats noted, but overall weight maintenance is satisfactory. Muscle mass has improved, and fat loss continues. Discussed the importance of not reverting to old habits and the potential for a lapse versus a relapse. - Continue current weight management plan with focus on maintaining weight during holidays. - Encouraged moderation in indulgences and return to routine after holidays.

## 2024-10-09 NOTE — Progress Notes (Signed)
 Office: [phone]  /  Fax: [phone]  Weight Summary and Body Composition Analysis (BIA)  Vitals Temp: 98.3 F (36.8 C) BP: 139/82 Pulse Rate: (!) 56 SpO2: 98 %   Anthropometric Measurements Height: 5' 7 (1.702 m) Weight: 191 lb (86.6 kg) BMI (Calculated): 29.91 Weight at Last Visit: 191 lb Weight Lost Since Last Visit: 0 lb Weight Gained Since Last Visit: 0 lb Starting Weight: 219 lb Total Weight Loss (lbs): 28 lb (12.7 kg) Peak Weight: 265 lb   Body Composition  Body Fat %: 28.1 % Fat Mass (lbs): 53.8 lbs Muscle Mass (lbs): 131.2 lbs Total Body Water (lbs): 95.4 lbs Visceral Fat Rating : 15    RMR: 1944  Today's Visit #: 8  Starting Date: 05/02/24   Subjective   Chief Complaint: Obesity  Interval History Discussed the use of AI scribe software for clinical note transcription with the patient, who gave verbal consent to proceed.  History of Present Illness Chad Brooks is a 58 year old male who presents for medical weight management.   He has maintained his weight despite recent holiday indulgences, including consuming a pound of chocolate and other treats. He has a history of significant weight fluctuations, with a peak weight of 265 pounds in 2016, reduced to 145 pounds in 2017 through a strict diet and increased physical activity. After breaking his arm, he was unable to maintain his exercise routine, contributing to weight regain.  He experiences sleep disturbances, often waking up at midnight and struggling to return to sleep, attributed to anxiety and racing thoughts about work and other life stressors. He sometimes eats breakfast in the early hours due to his inability to sleep and mentions frequent urination at night, which is lessened when he drinks less water.  He has a history of Fuchs corneodystrophy, significantly affecting his vision and causing blurriness. He is under the care of an ophthalmologist in [place] and plans to see a  careers adviser after the new year. He has a prescription for corrective lenses but has delayed obtaining them due to financial constraints with his FSA card.  His social history includes a fondness for sweets, particularly chocolate, and he consumes a significant amount of sugar in his coffee.     Challenges affecting patient progress: inadequate sleep  and moderate to high levels of stress.    Pharmacotherapy for weight management: He is currently taking no anti-obesity medication and patient has declined pharmacotherapy in past.   Assessment and Plan   Treatment Plan For Obesity:  Recommended Dietary Goals  Chad Brooks is currently in the action stage of change. As such, his goal is to continue weight management plan. He has agreed to: follow the Category 3 plan - 1500 kcal per day  Behavioral Health and Counseling  We discussed the following behavioral modification strategies today: work on managing stress, creating time for self-care and relaxation, avoiding temptations and identifying enticing environmental cues, continue to work on maintaining a reduced calorie state, getting the recommended amount of protein, incorporating whole foods, making healthy choices, staying well hydrated and practicing mindfulness when eating., increase protein intake, fibrous foods (25 grams per day for women, 30 grams for men) and water to improve satiety and decrease hunger signals. , and work on maintenance strategies during the holiday.  Additional education and resources provided today: None  Recommended Physical Activity Goals  [name] has been advised to work up to 150 minutes of moderate intensity aerobic activity a week and strengthening exercises 2-3 times per  week for cardiovascular health, weight loss maintenance and preservation of muscle mass.  He has agreed to :  Increase volume of physical activity to a goal of 240 minutes a week and Combine aerobic and strengthening exercises for efficiency and  improved cardiometabolic health.  Medical Interventions and Pharmacotherapy  We discussed various medication options to help [facility] with his weight loss efforts and we both agreed to : Continue with current nutritional and behavioral strategies and Has declined pharmacotherapy  Associated Conditions Impacted by Obesity Treatment  Assessment & Plan Psychophysiological insomnia Contributing to insomnia and difficulty sleeping. Anxiety is exacerbated by life stressors and work-related worries. Discussed the use of Lexapro  (escitalopram ) as a low-dose treatment to help manage anxiety and improve sleep. Lexapro  is non-sedating, non-habit forming, and does not cause weight gain. Potential side effects discussed.  - Prescribed Lexapro  10 mg tablets, instructed to start with 5 mg daily in the evening. - Instructed to increase to 10 mg daily after one week if tolerated.  Anxiety Likely related to generalized anxiety disorder. Difficulty falling asleep due to racing thoughts and anxiety about the next day. Discussed strategies to manage anxiety and improve sleep, including the use of Lexapro  and cognitive behavioral techniques. - Implement cognitive behavioral techniques for insomnia, including dedicated worry time after dinner. - Use Lexapro  as prescribed to help manage anxiety and improve sleep. OSA (obstructive sleep apnea) -reports sleep study in the past, untreated He has lost a significant amount of weight over time we will consider doing an overnight oxygen test in January to see if there is some nighttime hypoxemia indicative of possibly ongoing disordered sleep breathing.  This may also affect sleep maintenance and weight.  It may certainly contribute to some of his daytime somnolence and fatigue. Class 1 obesity with serious comorbidity and body mass index (BMI) of 30.0 to 30.9 in adult, unspecified obesity type Weight: decrease of 39.2 lb (17%) over 11 months, 3 weeks  Start: 10/19/2023 230 lb  3.2 oz (104.4 kg)  End: 10/09/2024 191 lb (86.6 kg)  Management is ongoing with a focus on maintaining weight during the holiday season. Recent indulgence in chocolate and other treats noted, but overall weight maintenance is satisfactory. Muscle mass has improved, and fat loss continues. Discussed the importance of not reverting to old habits and the potential for a lapse versus a relapse. - Continue current weight management plan with focus on maintaining weight during holidays. - Encouraged moderation in indulgences and return to routine after holidays.         Objective   Physical Exam:  Blood pressure 139/82, pulse (!) 56, temperature 98.3 F (36.8 C), height 5' 7 (1.702 m), weight 191 lb (86.6 kg), SpO2 98%. Body mass index is 29.91 kg/m.  General: He is overweight, cooperative, alert, well developed, and in no acute distress. PSYCH: Has normal mood, affect and thought process.   HEENT: EOMI, sclerae are anicteric. Lungs: Normal breathing effort, no conversational dyspnea. Extremities: No edema.  Neurologic: No gross sensory or motor deficits. No tremors or fasciculations noted.    Diagnostic Data Reviewed:  BMET    Component Value Date/Time   NA 136 05/02/2024 0849   K 4.8 05/02/2024 0849   CL 101 05/02/2024 0849   CO2 19 (L) 05/02/2024 0849   GLUCOSE 94 05/02/2024 0849   GLUCOSE 99 07/20/2023 1609   BUN 28 (H) 05/02/2024 0849   CREATININE 1.21 05/02/2024 0849   CALCIUM  9.4 05/02/2024 0849   Lab Results  Component Value  Date   HGBA1C 5.6 05/02/2024   Lab Results  Component Value Date   INSULIN  12.5 05/02/2024   Lab Results  Component Value Date   TSH 1.17 07/20/2023   CBC    Component Value Date/Time   WBC 7.1 07/20/2023 1609   RBC 4.52 07/20/2023 1609   HGB 13.8 07/20/2023 1609   HCT 41.6 07/20/2023 1609   PLT 174.0 07/20/2023 1609   MCV 92.0 07/20/2023 1609   MCH 30.0 05/28/2011 2220   MCHC 33.2 07/20/2023 1609   RDW 13.8 07/20/2023 1609    Iron Studies No results found for: IRON, TIBC, FERRITIN, IRONPCTSAT Lipid Panel     Component Value Date/Time   CHOL 133 07/20/2023 1609   TRIG 93.0 07/20/2023 1609   HDL 49.00 07/20/2023 1609   CHOLHDL 3 07/20/2023 1609   VLDL 18.6 07/20/2023 1609   LDLCALC 66 07/20/2023 1609   Hepatic Function Panel     Component Value Date/Time   PROT 6.9 05/02/2024 0849   ALBUMIN 4.5 05/02/2024 0849   AST 30 05/02/2024 0849   ALT 30 05/02/2024 0849   ALKPHOS 110 05/02/2024 0849   BILITOT 0.7 05/02/2024 0849      Component Value Date/Time   TSH 1.17 07/20/2023 1609   Nutritional Lab Results  Component Value Date   VD25OH 40.4 05/02/2024   VD25OH 45.80 07/20/2023    Medications: Outpatient Encounter Medications as of 10/09/2024  Medication Sig   amLODipine  (NORVASC ) 5 MG tablet Take 1 tablet (5 mg total) by mouth daily.   Ascorbic Acid (VITAMIN C) 1000 MG tablet Take 1,000 mg by mouth daily.   aspirin  (ASPIRIN  CHILDRENS) 81 MG chewable tablet Chew 1 tablet (81 mg total) by mouth daily.   meloxicam  (MOBIC ) 15 MG tablet Take 1 tablet (15 mg total) by mouth as needed for pain.   methocarbamol  (ROBAXIN ) 750 MG tablet Take 1 tablet (750 mg total) by mouth every 6 (six) hours as needed for muscle spasms.   Multiple Vitamin (ONE-A-DAY MENS PO) Take by mouth.   rosuvastatin  (CRESTOR ) 10 MG tablet Take 1 tablet (10 mg total) by mouth daily.   tadalafil  (CIALIS ) 5 MG tablet TAKE 1 TABLET BY MOUTH DAILY AS NEEDED FOR ERECTILE DYSFUNCTION   telmisartan  (MICARDIS ) 80 MG tablet Take 1 tablet (80 mg total) by mouth daily.   No facility-administered encounter medications on file as of 10/09/2024.     Follow-Up   No follow-ups on file.SABRA He was informed of the importance of frequent follow up visits to maximize his success with intensive lifestyle modifications for his multiple health conditions.  Attestation Statement   Reviewed by clinician on day of visit: allergies, medications,  problem list, medical history, surgical history, family history, social history, and previous encounter notes.     Lucas Parker, MD

## 2024-10-09 NOTE — Assessment & Plan Note (Signed)
 Contributing to insomnia and difficulty sleeping. Anxiety is exacerbated by life stressors and work-related worries. Discussed the use of Lexapro  (escitalopram ) as a low-dose treatment to help manage anxiety and improve sleep. Lexapro  is non-sedating, non-habit forming, and does not cause weight gain. Potential side effects discussed.  - Prescribed Lexapro  10 mg tablets, instructed to start with 5 mg daily in the evening. - Instructed to increase to 10 mg daily after one week if tolerated.

## 2024-10-15 ENCOUNTER — Other Ambulatory Visit: Payer: Self-pay

## 2024-10-15 DIAGNOSIS — N401 Enlarged prostate with lower urinary tract symptoms: Secondary | ICD-10-CM

## 2024-10-15 MED ORDER — TADALAFIL 5 MG PO TABS: 5.0000 mg | ORAL_TABLET | Freq: Every day | ORAL | 1 refills | Status: AC | PRN

## 2024-10-15 NOTE — Telephone Encounter (Signed)
 Refill request for  Tadalafil  5 mg LR  07/16/24, #90, 0 rf LOV  08/15/24 FOV  11/14/24  Please review and advise.  Thanks. Dm/cma

## 2024-11-05 ENCOUNTER — Other Ambulatory Visit (INDEPENDENT_AMBULATORY_CARE_PROVIDER_SITE_OTHER): Payer: Self-pay | Admitting: Internal Medicine

## 2024-11-05 DIAGNOSIS — F419 Anxiety disorder, unspecified: Secondary | ICD-10-CM

## 2024-11-05 DIAGNOSIS — S43401A Unspecified sprain of right shoulder joint, initial encounter: Secondary | ICD-10-CM

## 2024-11-06 ENCOUNTER — Encounter (INDEPENDENT_AMBULATORY_CARE_PROVIDER_SITE_OTHER): Payer: Self-pay | Admitting: Internal Medicine

## 2024-11-06 ENCOUNTER — Ambulatory Visit (INDEPENDENT_AMBULATORY_CARE_PROVIDER_SITE_OTHER): Admitting: Internal Medicine

## 2024-11-06 VITALS — BP 127/79 | HR 56 | Temp 98.4°F | Ht 67.0 in | Wt 193.0 lb

## 2024-11-06 DIAGNOSIS — Z6830 Body mass index (BMI) 30.0-30.9, adult: Secondary | ICD-10-CM | POA: Diagnosis not present

## 2024-11-06 DIAGNOSIS — E66811 Obesity, class 1: Secondary | ICD-10-CM | POA: Diagnosis not present

## 2024-11-06 DIAGNOSIS — I10 Essential (primary) hypertension: Secondary | ICD-10-CM

## 2024-11-06 DIAGNOSIS — F419 Anxiety disorder, unspecified: Secondary | ICD-10-CM | POA: Diagnosis not present

## 2024-11-06 MED ORDER — ESCITALOPRAM OXALATE 10 MG PO TABS: 10.0000 mg | ORAL_TABLET | Freq: Every day | ORAL | 0 refills | Status: AC

## 2024-11-06 NOTE — Assessment & Plan Note (Signed)
 Vitals:   11/06/24 1600  BP: 127/79    Blood pressure is at goal for age and risk category.  On amlodipine  and telmisartan  without adverse effects.  Most recent renal parameters reviewed which showed stable electrolytes and kidney function.  Continue current regimen.  Continue current weight management strategy

## 2024-11-06 NOTE — Assessment & Plan Note (Signed)
 Weight: decrease of 36 lb (15.7%) over 10 months, 1 week  Start: 12/26/2023 229 lb (103.9 kg)  End: 11/06/2024 193 lb (87.5 kg)  Weight gain of two pounds over the holidays, attributed to dietary lapses and reduced physical activity. Body fat percentage remains at 28%. Challenges with sugar cravings, potentially related to stress and insulin  fluctuations. Successful weight management without medication. - Continue current weight management plan. - Encouraged resumption of routine diet and exercise. - Discussed potential use of medications for sugar cravings if needed.

## 2024-11-06 NOTE — Assessment & Plan Note (Signed)
 Managed with escitalopram  10 mg. Reports improvement in anxiety symptoms, particularly at night, with reduced racing thoughts. Experiences a bad taste in the mouth after taking the medication. Medication helps quiet the mind and reduce stress-related irritability.  Would like to continue medication as he has found it beneficial. - Continue escitalopram  10 mg daily. - Consider taking escitalopram  during the day if it aids sleep.

## 2024-11-06 NOTE — Progress Notes (Signed)
 "  Office: [phone]  /  Fax: [phone]  Weight Summary and Body Composition Analysis (BIA)  Vitals Temp: 98.4 F (36.9 C) BP: 127/79 Pulse Rate: (!) 56 SpO2: 98 %   Anthropometric Measurements Height: 5' 7 (1.702 m) Weight: 193 lb (87.5 kg) BMI (Calculated): 30.22 Weight at Last Visit: 191 lb Weight Lost Since Last Visit: 0 lb Weight Gained Since Last Visit: 2 lb Starting Weight: 219 lb Total Weight Loss (lbs): 26 lb (11.8 kg) Peak Weight: 265 lb   Body Composition  Body Fat %: 28.7 % Fat Mass (lbs): 55.4 lbs Muscle Mass (lbs): 130.8 lbs Total Body Water (lbs): 96 lbs Visceral Fat Rating : 15    RMR: 1944  Today's Visit #: 9  Starting Date: 05/02/24   Subjective   Chief Complaint: Obesity  Interval History  Discussed the use of AI scribe software for clinical note transcription with the patient, who gave verbal consent to proceed.  History of Present Illness Chad Brooks is a 59 year old male who presents for medical weight management.  He has gained two pounds over the holiday season, which he attributes to a disruption in his routine, increased sugar and sweets consumption, and decreased physical activity. He indulged in foods like [employer], French fries, apple pie, and chocolate chip cookies. Despite these dietary lapses, he is surprised that his weight gain was only two pounds.  He struggles with sugar cravings, which he associates with stress and anxiety, particularly due to a house remodeling project. He uses sugar in his coffee and consumes 'rock stars' energy drinks, which are high in sugar, to satisfy these cravings.  He has been on escitalopram  for a month, which has helped him feel calmer and less anxious, although he still experiences some anxiety and sleep disturbances. He has not been taking meloxicam  recently and has a full bottle at home.  His sleep pattern is disrupted, often waking up at night and not feeling fully rested. He  describes his routine of waking up at 11:30 PM, dozing off in a recliner, and then getting up at 3:00 AM to start his day. His dog's behavior affects his and his wife's sleep.  He has considered starting an exercise routine with his wife, who goes to a gym, but has not yet begun. He acknowledges the benefits of exercise but finds it challenging to incorporate into his schedule due to his work hours and fatigue.     Challenges affecting patient progress: work schedule and strong sugar cravings.    Pharmacotherapy for weight management: He is currently taking no anti-obesity medication.   Assessment and Plan   Treatment Plan For Obesity:  Recommended Dietary Goals  Chad Brooks is currently in the action stage of change. As such, his goal is to continue weight management plan. He has agreed to: continue current plan and continue to work on implementation of reduced calorie nutrition plan (RCNP)  Behavioral Health and Counseling  We discussed the following behavioral modification strategies today: decreasing simple carbohydrates , work on meal planning and preparation, getting back on track after recent lapse, and display self-compassion.  Additional education and resources provided today: None  Recommended Physical Activity Goals  Chad Brooks has been advised to work up to 150 minutes of moderate intensity aerobic activity a week and strengthening exercises 2-3 times per week for cardiovascular health, weight loss maintenance and preservation of muscle mass.  He has agreed to :  Start working out once a week with his wife on his  days off aiming for about 60 minutes with combined endurance and strengthening  Medical Interventions and Pharmacotherapy  We discussed various medication options to help Chad Brooks with his weight loss efforts and we both agreed to : Continue with current nutritional and behavioral strategies  Associated Conditions Impacted by Obesity Treatment  Assessment &  Plan Anxiety Managed with escitalopram  10 mg. Reports improvement in anxiety symptoms, particularly at night, with reduced racing thoughts. Experiences a bad taste in the mouth after taking the medication. Medication helps quiet the mind and reduce stress-related irritability.  Would like to continue medication as he has found it beneficial. - Continue escitalopram  10 mg daily. - Consider taking escitalopram  during the day if it aids sleep. Essential hypertension Vitals:   11/06/24 1600  BP: 127/79    Blood pressure is at goal for age and risk category.  On amlodipine  and telmisartan  without adverse effects.  Most recent renal parameters reviewed which showed stable electrolytes and kidney function.  Continue current regimen.  Continue current weight management strategy  Class 1 obesity with serious comorbidity and body mass index (BMI) of 30.0 to 30.9 in adult, unspecified obesity type Weight: decrease of 36 lb (15.7%) over 10 months, 1 week  Start: 12/26/2023 229 lb (103.9 kg)  End: 11/06/2024 193 lb (87.5 kg)  Weight gain of two pounds over the holidays, attributed to dietary lapses and reduced physical activity. Body fat percentage remains at 28%. Challenges with sugar cravings, potentially related to stress and insulin  fluctuations. Successful weight management without medication. - Continue current weight management plan. - Encouraged resumption of routine diet and exercise. - Discussed potential use of medications for sugar cravings if needed.       Objective   Physical Exam:  Blood pressure 127/79, pulse (!) 56, temperature 98.4 F (36.9 C), height 5' 7 (1.702 m), weight 193 lb (87.5 kg), SpO2 98%. Body mass index is 30.23 kg/m.  General: He is overweight, cooperative, alert, well developed, and in no acute distress. PSYCH: Has normal mood, affect and thought process.   HEENT: EOMI, sclerae are anicteric. Lungs: Normal breathing effort, no conversational  dyspnea. Extremities: No edema.  Neurologic: No gross sensory or motor deficits. No tremors or fasciculations noted.    Diagnostic Data Reviewed:  BMET    Component Value Date/Time   NA 136 05/02/2024 0849   K 4.8 05/02/2024 0849   CL 101 05/02/2024 0849   CO2 19 (L) 05/02/2024 0849   GLUCOSE 94 05/02/2024 0849   GLUCOSE 99 07/20/2023 1609   BUN 28 (H) 05/02/2024 0849   CREATININE 1.21 05/02/2024 0849   CALCIUM  9.4 05/02/2024 0849   Lab Results  Component Value Date   HGBA1C 5.6 05/02/2024   Lab Results  Component Value Date   INSULIN  12.5 05/02/2024   Lab Results  Component Value Date   TSH 1.17 07/20/2023   CBC    Component Value Date/Time   WBC 7.1 07/20/2023 1609   RBC 4.52 07/20/2023 1609   HGB 13.8 07/20/2023 1609   HCT 41.6 07/20/2023 1609   PLT 174.0 07/20/2023 1609   MCV 92.0 07/20/2023 1609   MCH 30.0 05/28/2011 2220   MCHC 33.2 07/20/2023 1609   RDW 13.8 07/20/2023 1609   Iron Studies No results found for: IRON, TIBC, FERRITIN, IRONPCTSAT Lipid Panel     Component Value Date/Time   CHOL 133 07/20/2023 1609   TRIG 93.0 07/20/2023 1609   HDL 49.00 07/20/2023 1609   CHOLHDL 3 07/20/2023 1609  VLDL 18.6 07/20/2023 1609   LDLCALC 66 07/20/2023 1609   Hepatic Function Panel     Component Value Date/Time   PROT 6.9 05/02/2024 0849   ALBUMIN 4.5 05/02/2024 0849   AST 30 05/02/2024 0849   ALT 30 05/02/2024 0849   ALKPHOS 110 05/02/2024 0849   BILITOT 0.7 05/02/2024 0849      Component Value Date/Time   TSH 1.17 07/20/2023 1609   Nutritional Lab Results  Component Value Date   VD25OH 40.4 05/02/2024   VD25OH 45.80 07/20/2023    Medications: Outpatient Encounter Medications as of 11/06/2024  Medication Sig   amLODipine  (NORVASC ) 5 MG tablet Take 1 tablet (5 mg total) by mouth daily.   Ascorbic Acid (VITAMIN C) 1000 MG tablet Take 1,000 mg by mouth daily.   aspirin  (ASPIRIN  CHILDRENS) 81 MG chewable tablet Chew 1 tablet (81 mg  total) by mouth daily.   meloxicam  (MOBIC ) 15 MG tablet TAKE 1 TABLET BY MOUTH DAILY AS NEEDED FOR PAIN   methocarbamol  (ROBAXIN ) 750 MG tablet Take 1 tablet (750 mg total) by mouth every 6 (six) hours as needed for muscle spasms.   Multiple Vitamin (ONE-A-DAY MENS PO) Take by mouth.   rosuvastatin  (CRESTOR ) 10 MG tablet Take 1 tablet (10 mg total) by mouth daily.   tadalafil  (CIALIS ) 5 MG tablet Take 1 tablet (5 mg total) by mouth daily as needed for erectile dysfunction.   telmisartan  (MICARDIS ) 80 MG tablet Take 1 tablet (80 mg total) by mouth daily.   [DISCONTINUED] escitalopram  (LEXAPRO ) 10 MG tablet Take 1 tablet (10 mg total) by mouth daily.   escitalopram  (LEXAPRO ) 10 MG tablet Take 1 tablet (10 mg total) by mouth daily.   No facility-administered encounter medications on file as of 11/06/2024.     Follow-Up   Return in about 4 weeks (around 12/04/2024) for For Weight Mangement with Dr. Francyne.SABRA He was informed of the importance of frequent follow up visits to maximize his success with intensive lifestyle modifications for his multiple health conditions.  Attestation Statement   Reviewed by clinician on day of visit: allergies, medications, problem list, medical history, surgical history, family history, social history, and previous encounter notes.     Lucas Francyne, MD  "

## 2024-11-14 ENCOUNTER — Encounter: Payer: Self-pay | Admitting: Family Medicine

## 2024-11-14 ENCOUNTER — Ambulatory Visit: Admitting: Family Medicine

## 2024-11-14 VITALS — BP 130/78 | HR 52 | Temp 97.9°F | Ht 67.0 in | Wt 194.0 lb

## 2024-11-14 DIAGNOSIS — E782 Mixed hyperlipidemia: Secondary | ICD-10-CM

## 2024-11-14 DIAGNOSIS — Z6830 Body mass index (BMI) 30.0-30.9, adult: Secondary | ICD-10-CM

## 2024-11-14 DIAGNOSIS — E66811 Obesity, class 1: Secondary | ICD-10-CM | POA: Diagnosis not present

## 2024-11-14 DIAGNOSIS — I10 Essential (primary) hypertension: Secondary | ICD-10-CM

## 2024-11-14 DIAGNOSIS — F17201 Nicotine dependence, unspecified, in remission: Secondary | ICD-10-CM | POA: Diagnosis not present

## 2024-11-14 MED ORDER — METHOCARBAMOL 750 MG PO TABS: 750.0000 mg | ORAL_TABLET | Freq: Four times a day (QID) | ORAL | 0 refills | Status: AC | PRN

## 2024-11-14 MED ORDER — ROSUVASTATIN CALCIUM 10 MG PO TABS: 10.0000 mg | ORAL_TABLET | Freq: Every day | ORAL | 3 refills | Status: DC

## 2024-11-14 MED ORDER — AMLODIPINE BESYLATE 5 MG PO TABS: 5.0000 mg | ORAL_TABLET | Freq: Every day | ORAL | 3 refills | Status: AC

## 2024-11-14 MED ORDER — TELMISARTAN 80 MG PO TABS: 80.0000 mg | ORAL_TABLET | Freq: Every day | ORAL | 3 refills | Status: AC

## 2024-11-14 NOTE — Assessment & Plan Note (Addendum)
 Maximum weight: 230 lbs (10/2023) Current weight: 194 lbs Weight change since last visit: + 1 lbs Total weight loss: - 36 lbs (15.6 %)  Congratulated Chad Brooks on his success with weight loss so far.Continue efforts with [facility] program.

## 2024-11-14 NOTE — Assessment & Plan Note (Signed)
 Sustained remission for 14 years. Due for annual LDCT for lung cancer screening.

## 2024-11-14 NOTE — Assessment & Plan Note (Addendum)
 Blood pressure is in good control. Continue amlodipine  5 mg and telmisartan  80 mg daily.

## 2024-11-14 NOTE — Progress Notes (Signed)
 " [facility] [address] [place] [address] Dept: [phone] Dept Fax: [phone]  Chronic Care Office Visit  Subjective:    Patient ID: Chad Brooks, male    DOB: May 22, 1966, 59 y.o..   MRN: [id number]  Chief Complaint  Patient presents with   Hypertension    3 month f/u HTN.    History of Present Illness:  Patient is in today for reassessment of chronic medical conditions.   Chad Brooks has a history of hypertension. He is managed on amlodipine  5 mg daily and telmisartan  80 mg daily.   Chad Brooks has a history of hyperlipidemia. He is managed on rosuvastatin  10 mg daily.   Chad Brooks is engaged with [facility] program. He is following a calorie controlled, high protein diet. He has had a small amount of weight regain over the holidays, but remains committed to his weight loss efforts.   Past Medical History: Patient Active Problem List   Diagnosis Date Noted   Psychophysiological insomnia 10/09/2024   Generalized obesity -with a starting BMI of 34 09/11/2024   Sleep difficulties 08/09/2024   Anxiety 08/09/2024   Tobacco dependence in remission 05/15/2024   OSA (obstructive sleep apnea) -reports sleep study in the past, untreated 05/02/2024   Class 1 obesity with serious comorbidity and body mass index (BMI) of 30.0 to 30.9 in adult 05/02/2024   Aortic atherosclerosis 08/19/2023   Coronary atherosclerosis 08/19/2023   Emphysema of lung (HCC) 08/19/2023   Essential hypertension 07/20/2023   Arthritis of carpometacarpal (CMC) joint of both thumbs 07/20/2023   Hyperlipidemia 07/20/2023   Benign prostatic hyperplasia 07/20/2023   Fuchs' corneal dystrophy 07/20/2023   History of rheumatic fever as a child 07/20/2023   Past Surgical History:  Procedure Laterality Date   ANTERIOR CRUCIATE LIGAMENT REPAIR     BUNIONECTOMY Right    FRACTURE SURGERY  12/09/2016   Left humerus repair   HUMERUS  FRACTURE SURGERY Left 12/09/2016   Left humerus repair   KNEE ARTHROSCOPY Left 1993   ROTATOR CUFF REPAIR Right    x 2   Family History  Problem Relation Age of Onset   COPD Mother    Heart disease Mother    Miscarriages / Stillbirths Mother    Vision loss Mother    Hypertension Mother    Hyperlipidemia Mother    Hearing loss Father    Peripheral Artery Disease Father    Cancer Sister        Breast   Birth defects Brother    Diabetes Maternal Grandmother    Heart disease Maternal Grandmother    Heart disease Paternal Uncle    Outpatient Medications Prior to Visit  Medication Sig Dispense Refill   amLODipine  (NORVASC ) 5 MG tablet Take 1 tablet (5 mg total) by mouth daily. 90 tablet 3   Ascorbic Acid (VITAMIN C) 1000 MG tablet Take 1,000 mg by mouth daily.     aspirin  (ASPIRIN  CHILDRENS) 81 MG chewable tablet Chew 1 tablet (81 mg total) by mouth daily.     escitalopram  (LEXAPRO ) 10 MG tablet Take 1 tablet (10 mg total) by mouth daily. 90 tablet 0   meloxicam  (MOBIC ) 15 MG tablet TAKE 1 TABLET BY MOUTH DAILY AS NEEDED FOR PAIN 30 tablet 2   methocarbamol  (ROBAXIN ) 750 MG tablet Take 1 tablet (750 mg total) by mouth every 6 (six) hours as needed for muscle spasms. 30 tablet 0   Multiple Vitamin (ONE-A-DAY MENS  PO) Take by mouth.     rosuvastatin  (CRESTOR ) 10 MG tablet Take 1 tablet (10 mg total) by mouth daily. 90 tablet 3   tadalafil  (CIALIS ) 5 MG tablet Take 1 tablet (5 mg total) by mouth daily as needed for erectile dysfunction. 90 tablet 1   telmisartan  (MICARDIS ) 80 MG tablet Take 1 tablet (80 mg total) by mouth daily. 90 tablet 3   No facility-administered medications prior to visit.   Allergies[1]   Objective:   Today's Vitals   11/14/24 1513  BP: 130/78  Pulse: (!) 52  Temp: 97.9 F (36.6 C)  TempSrc: Temporal  SpO2: 98%  Weight: 194 lb (88 kg)  Height: 5' 7 (1.702 m)   Body mass index is 30.38 kg/m.   General: Well developed, well nourished. No acute  distress. Psych: Alert and oriented. Normal mood and affect.  Health Maintenance Due  Topic Date Due   HIV Screening  Never done   Hepatitis B Vaccines 19-59 Average Risk (1 of 3 - 19+ 3-dose series) Never done   COVID-19 Vaccine (1 - 2025-26 season) Never done   Lung Cancer Screening  08/03/2024     Assessment & Plan:   Problem List Items Addressed This Visit       Cardiovascular and Mediastinum   Essential hypertension - Primary   Blood pressure is in good control. Continue amlodipine  5 mg and telmisartan  80 mg daily.      Relevant Medications   telmisartan  (MICARDIS ) 80 MG tablet   amLODipine  (NORVASC ) 5 MG tablet   rosuvastatin  (CRESTOR ) 10 MG tablet     Other   Class 1 obesity with serious comorbidity and body mass index (BMI) of 30.0 to 30.9 in adult   Maximum weight: 230 lbs (10/2023) Current weight: 194 lbs Weight change since last visit: + 1 lbs Total weight loss: - 36 lbs (15.6 %)  Congratulated Chad Brooks on his success with weight loss so far.Continue efforts with [facility] program.      Hyperlipidemia   We will recheck lipids today. Continue rosuvastatin  10 mg daily.      Relevant Medications   telmisartan  (MICARDIS ) 80 MG tablet   amLODipine  (NORVASC ) 5 MG tablet   rosuvastatin  (CRESTOR ) 10 MG tablet   Other Relevant Orders   Lipid panel   Comprehensive metabolic panel with GFR   Tobacco dependence in remission   Sustained remission for 14 years. Due for annual LDCT for lung cancer screening.      Relevant Orders   CT CHEST LUNG CANCER SCREENING LOW DOSE WO CONTRAST    Return in about 4 months (around 03/14/2025) for Reassessment.   [name], MD  I,Emily Lagle,acting as a scribe for [name], MD.,have documented all relevant documentation on the behalf of [name], MD.  I, [name], MD, have reviewed all documentation for this visit. The documentation on 11/14/2024 for the exam, diagnosis, procedures, and orders are all  accurate and complete.     [1] No Known Allergies  "

## 2024-11-14 NOTE — Assessment & Plan Note (Addendum)
 We will recheck lipids today. Continue rosuvastatin  10 mg daily.

## 2024-11-14 NOTE — Assessment & Plan Note (Signed)
 Anxiety appears to be contributing to insomnia, with stressors including work-related concerns and home remodeling.

## 2024-11-15 LAB — LIPID PANEL
Cholesterol: 224 mg/dL — ABNORMAL HIGH (ref 28–200)
HDL: 56.3 mg/dL
LDL Cholesterol: 155 mg/dL — ABNORMAL HIGH (ref 10–99)
NonHDL: 167.95
Total CHOL/HDL Ratio: 4
Triglycerides: 67 mg/dL (ref 10.0–149.0)
VLDL: 13.4 mg/dL (ref 0.0–40.0)

## 2024-11-15 LAB — COMPREHENSIVE METABOLIC PANEL WITH GFR
ALT: 26 U/L (ref 3–53)
AST: 27 U/L (ref 5–37)
Albumin: 4.4 g/dL (ref 3.5–5.2)
Alkaline Phosphatase: 72 U/L (ref 39–117)
BUN: 33 mg/dL — ABNORMAL HIGH (ref 6–23)
CO2: 29 meq/L (ref 19–32)
Calcium: 9.2 mg/dL (ref 8.4–10.5)
Chloride: 99 meq/L (ref 96–112)
Creatinine, Ser: 1.27 mg/dL (ref 0.40–1.50)
GFR: 62.01 mL/min
Glucose, Bld: 86 mg/dL (ref 70–99)
Potassium: 4.8 meq/L (ref 3.5–5.1)
Sodium: 134 meq/L — ABNORMAL LOW (ref 135–145)
Total Bilirubin: 0.6 mg/dL (ref 0.2–1.2)
Total Protein: 7.2 g/dL (ref 6.0–8.3)

## 2024-11-16 ENCOUNTER — Ambulatory Visit: Payer: Self-pay | Admitting: Family Medicine

## 2024-11-16 DIAGNOSIS — E782 Mixed hyperlipidemia: Secondary | ICD-10-CM

## 2024-11-19 MED ORDER — ROSUVASTATIN CALCIUM 20 MG PO TABS: 20.0000 mg | ORAL_TABLET | Freq: Every day | ORAL | 3 refills | Status: AC

## 2024-11-20 ENCOUNTER — Encounter: Payer: Self-pay | Admitting: Family Medicine

## 2024-11-30 ENCOUNTER — Ambulatory Visit
Admission: RE | Admit: 2024-11-30 | Discharge: 2024-11-30 | Disposition: A | Source: Ambulatory Visit | Attending: Family Medicine

## 2024-11-30 DIAGNOSIS — F17201 Nicotine dependence, unspecified, in remission: Secondary | ICD-10-CM

## 2024-12-05 ENCOUNTER — Encounter (INDEPENDENT_AMBULATORY_CARE_PROVIDER_SITE_OTHER): Payer: Self-pay | Admitting: Internal Medicine

## 2024-12-05 ENCOUNTER — Ambulatory Visit (INDEPENDENT_AMBULATORY_CARE_PROVIDER_SITE_OTHER): Admitting: Internal Medicine

## 2024-12-05 VITALS — BP 134/80 | HR 60 | Temp 98.8°F | Ht 67.0 in | Wt 194.0 lb

## 2024-12-05 DIAGNOSIS — E66811 Obesity, class 1: Secondary | ICD-10-CM

## 2024-12-05 DIAGNOSIS — G4733 Obstructive sleep apnea (adult) (pediatric): Secondary | ICD-10-CM | POA: Diagnosis not present

## 2024-12-05 DIAGNOSIS — I10 Essential (primary) hypertension: Secondary | ICD-10-CM

## 2024-12-05 DIAGNOSIS — Z6830 Body mass index (BMI) 30.0-30.9, adult: Secondary | ICD-10-CM

## 2024-12-05 NOTE — Progress Notes (Unsigned)
 "  Office: [phone]  /  Fax: [phone]  Weight Summary and Body Composition Analysis (BIA)  Vitals Temp: 98.8 F (37.1 C) BP: 134/80 Pulse Rate: 60 SpO2: 98 %   Anthropometric Measurements Height: 5' 7 (1.702 m) Weight: 194 lb (88 kg) BMI (Calculated): 30.38 Weight at Last Visit: 193 lb Weight Lost Since Last Visit: 0 lb Weight Gained Since Last Visit: 1 lb Starting Weight: 219 lb Total Weight Loss (lbs): 25 lb (11.3 kg) Peak Weight: 265 lb   Body Composition  Body Fat %: 29.3 % Fat Mass (lbs): 57 lbs Muscle Mass (lbs): 130.4 lbs Total Body Water (lbs): 95 lbs Visceral Fat Rating : 16    RMR: 1944  Today's Visit #: 10  Starting Date: 05/02/24   Subjective   Chief Complaint: Obesity  Interval History  Discussed the use of AI scribe software for clinical note transcription with the patient, who gave verbal consent to proceed.  History of Present Illness Chad Brooks is a 59 year old male who presents for medical remanagement.  He has faced challenges in maintaining weight loss during the holiday season and recent snowstorms, leading to increased snacking and decreased physical activity. His diet is effective when followed, but routine disruptions and boredom eating have been problematic.  He consumes approximately 1800 calories daily, which maintains his current weight, but aims to reduce this to 1500 calories to continue losing weight. His typical breakfast includes three eggs, two pieces of bread, and a piece of lunch meat, using 45-calorie bread. Occasional deviations from his diet include peanut butter sandwiches and energy drinks, contributing to excess calorie intake.  He mentions significant weight loss in the past but notes a plateau during the holidays. He aims to reach a goal weight of 170-175 pounds.  He reports a recent increase in cholesterol levels from 66 to 155, despite taking Crestor  20 mg daily. He attributes this rise to not fasting  before the blood test and continues to take his medication regularly.  He experiences hunger when out of routine, leading to consumption of unhealthy snacks.     Challenges affecting patient progress: recent lapse in weight management plan due to work, travel, illness or family circumstances.    Pharmacotherapy for weight management: He is currently taking no anti-obesity medication and patient has declined pharmacotherapy in past.   Assessment and Plan   Treatment Plan For Obesity:  Recommended Dietary Goals  Benjimin is currently in the action stage of change. As such, his goal is to continue weight management plan. He has agreed to: follow a balanced (30%/40%/30%), whole foods-based, reduced-calorie meal plan (RCNP) targeting 1500 kcal per day, incorporate prepackaged healthy meals for convenience, and incorporate 1-2 meal replacements a day for convenience   Behavioral Health and Counseling  We discussed the following behavioral modification strategies today: increasing lean protein intake to established goals, decreasing simple carbohydrates , increasing fiber rich foods, continue to work on implementation of reduced calorie nutritional plan, and getting back on track after recent lapse.  Additional education and resources provided today: New meal plan provided  Recommended Physical Activity Goals  Delmas has been advised to work up to 150 minutes of moderate intensity aerobic activity a week and strengthening exercises 2-3 times per week for cardiovascular health, weight loss maintenance and preservation of muscle mass.  He has agreed to :  Think about enjoyable ways to increase daily physical activity and overcoming barriers to exercise and Increase physical activity in their day and reduce  sedentary time (increase NEAT).  Medical Interventions and Pharmacotherapy  We discussed various medication options to help [facility] with his weight loss efforts and we both agreed to : Continue  with current nutritional and behavioral strategies and Has declined pharmacotherapy  Associated Conditions Impacted by Obesity Treatment  Assessment & Plan Essential hypertension Blood pressure is close to goal currently on amlodipine  5 mg a day, telmisartan  80 mg daily without any adverse effects. Vitals:   12/05/24 1500  BP: 134/80   Stable. Blood pressure control expected to improve with continued weight reduction. No medication changes indicated today; continue current antihypertensive regimen and home BP monitoring.   OSA (obstructive sleep apnea) -reports sleep study in the past, untreated Stable on current therapy. Weight reduction is anticipated to improve OSA severity. Continue current management; reassess symptoms as weight loss progresses.  Class 1 obesity with serious comorbidity and body mass index (BMI) of 30.0 to 30.9 in adult, unspecified obesity type Weight: decrease of 35.4 lb (15.4%) over 10 months, 2 weeks  Start: 01/17/2024 229 lb 6.4 oz (104.1 kg)  End: 12/05/2024 194 lb (88 kg)  Management is ongoing with a focus on dietary habits and physical activity. Recent weight gain attributed to holiday indulgences and decreased physical activity due to weather. Current weight maintenance is positive, but further weight loss is desired. Discussed the impact of caloric intake on weight maintenance and the importance of adhering to a 1500 calorie diet. Emphasized the role of physical activity in weight management and the potential need for medication to aid in appetite control. Discussed the importance of not feeling deprived and allowing occasional treat days to maintain dietary adherence. Highlighted the potential for weight regain due to metabolic adaptations and the role of antiobesity medications. - Continue 1500 calorie diet with 30% protein, 40% carbs, 30% fats. - Incorporate meal planning and preparation to maintain dietary adherence. - Consider using meal replacements like  protein smoothies or prepackaged meals for convenience. - Increase physical activity as weather permits. - Allow occasional treat days to prevent feelings of deprivation. - Will consider weight loss medications if dietary and lifestyle changes are insufficient. - Will schedule follow-up in one month to assess progress.         Objective   Physical Exam:  Blood pressure 134/80, pulse 60, temperature 98.8 F (37.1 C), height 5' 7 (1.702 m), weight 194 lb (88 kg), SpO2 98%. Body mass index is 30.38 kg/m.  General: He is overweight, cooperative, alert, well developed, and in no acute distress. PSYCH: Has normal mood, affect and thought process.   HEENT: EOMI, sclerae are anicteric. Lungs: Normal breathing effort, no conversational dyspnea. Extremities: No edema.  Neurologic: No gross sensory or motor deficits. No tremors or fasciculations noted.    Diagnostic Data Reviewed:  BMET    Component Value Date/Time   NA 134 (L) 11/14/2024 1558   NA 136 05/02/2024 0849   K 4.8 11/14/2024 1558   CL 99 11/14/2024 1558   CO2 29 11/14/2024 1558   GLUCOSE 86 11/14/2024 1558   BUN 33 (H) 11/14/2024 1558   BUN 28 (H) 05/02/2024 0849   CREATININE 1.27 11/14/2024 1558   CALCIUM  9.2 11/14/2024 1558   Lab Results  Component Value Date   HGBA1C 5.6 05/02/2024   Lab Results  Component Value Date   INSULIN  12.5 05/02/2024   Lab Results  Component Value Date   TSH 1.17 07/20/2023   CBC    Component Value Date/Time   WBC 7.1 07/20/2023  1609   RBC 4.52 07/20/2023 1609   HGB 13.8 07/20/2023 1609   HCT 41.6 07/20/2023 1609   PLT 174.0 07/20/2023 1609   MCV 92.0 07/20/2023 1609   MCH 30.0 05/28/2011 2220   MCHC 33.2 07/20/2023 1609   RDW 13.8 07/20/2023 1609   Iron Studies No results found for: IRON, TIBC, FERRITIN, IRONPCTSAT Lipid Panel     Component Value Date/Time   CHOL 224 (H) 11/14/2024 1558   TRIG 67.0 11/14/2024 1558   HDL 56.30 11/14/2024 1558   CHOLHDL  4 11/14/2024 1558   VLDL 13.4 11/14/2024 1558   LDLCALC 155 (H) 11/14/2024 1558   Hepatic Function Panel     Component Value Date/Time   PROT 7.2 11/14/2024 1558   PROT 6.9 05/02/2024 0849   ALBUMIN 4.4 11/14/2024 1558   ALBUMIN 4.5 05/02/2024 0849   AST 27 11/14/2024 1558   ALT 26 11/14/2024 1558   ALKPHOS 72 11/14/2024 1558   BILITOT 0.6 11/14/2024 1558   BILITOT 0.7 05/02/2024 0849      Component Value Date/Time   TSH 1.17 07/20/2023 1609   Nutritional Lab Results  Component Value Date   VD25OH 40.4 05/02/2024   VD25OH 45.80 07/20/2023    Medications: Outpatient Encounter Medications as of 12/05/2024  Medication Sig   amLODipine  (NORVASC ) 5 MG tablet Take 1 tablet (5 mg total) by mouth daily.   Ascorbic Acid (VITAMIN C) 1000 MG tablet Take 1,000 mg by mouth daily.   aspirin  (ASPIRIN  CHILDRENS) 81 MG chewable tablet Chew 1 tablet (81 mg total) by mouth daily.   escitalopram  (LEXAPRO ) 10 MG tablet Take 1 tablet (10 mg total) by mouth daily.   meloxicam  (MOBIC ) 15 MG tablet TAKE 1 TABLET BY MOUTH DAILY AS NEEDED FOR PAIN   methocarbamol  (ROBAXIN ) 750 MG tablet Take 1 tablet (750 mg total) by mouth every 6 (six) hours as needed for muscle spasms.   Multiple Vitamin (ONE-A-DAY MENS PO) Take by mouth.   rosuvastatin  (CRESTOR ) 20 MG tablet Take 1 tablet (20 mg total) by mouth daily.   tadalafil  (CIALIS ) 5 MG tablet Take 1 tablet (5 mg total) by mouth daily as needed for erectile dysfunction.   telmisartan  (MICARDIS ) 80 MG tablet Take 1 tablet (80 mg total) by mouth daily.   No facility-administered encounter medications on file as of 12/05/2024.     Follow-Up   No follow-ups on file.SABRA He was informed of the importance of frequent follow up visits to maximize his success with intensive lifestyle modifications for his multiple health conditions.  Attestation Statement   Reviewed by clinician on day of visit: allergies, medications, problem list, medical history, surgical  history, family history, social history, and previous encounter notes.     Lucas Parker, MD  "

## 2024-12-06 NOTE — Assessment & Plan Note (Signed)
 Blood pressure is close to goal currently on amlodipine  5 mg a day, telmisartan  80 mg daily without any adverse effects. Vitals:   12/05/24 1500  BP: 134/80   Stable. Blood pressure control expected to improve with continued weight reduction. No medication changes indicated today; continue current antihypertensive regimen and home BP monitoring.

## 2024-12-06 NOTE — Assessment & Plan Note (Signed)
 Stable on current therapy. Weight reduction is anticipated to improve OSA severity. Continue current management; reassess symptoms as weight loss progresses.

## 2024-12-06 NOTE — Assessment & Plan Note (Signed)
 Weight: decrease of 35.4 lb (15.4%) over 10 months, 2 weeks  Start: 01/17/2024 229 lb 6.4 oz (104.1 kg)  End: 12/05/2024 194 lb (88 kg)  Management is ongoing with a focus on dietary habits and physical activity. Recent weight gain attributed to holiday indulgences and decreased physical activity due to weather. Current weight maintenance is positive, but further weight loss is desired. Discussed the impact of caloric intake on weight maintenance and the importance of adhering to a 1500 calorie diet. Emphasized the role of physical activity in weight management and the potential need for medication to aid in appetite control. Discussed the importance of not feeling deprived and allowing occasional treat days to maintain dietary adherence. Highlighted the potential for weight regain due to metabolic adaptations and the role of antiobesity medications. - Continue 1500 calorie diet with 30% protein, 40% carbs, 30% fats. - Incorporate meal planning and preparation to maintain dietary adherence. - Consider using meal replacements like protein smoothies or prepackaged meals for convenience. - Increase physical activity as weather permits. - Allow occasional treat days to prevent feelings of deprivation. - Will consider weight loss medications if dietary and lifestyle changes are insufficient. - Will schedule follow-up in one month to assess progress.

## 2025-01-03 ENCOUNTER — Ambulatory Visit (INDEPENDENT_AMBULATORY_CARE_PROVIDER_SITE_OTHER): Admitting: Internal Medicine

## 2025-02-05 ENCOUNTER — Ambulatory Visit (INDEPENDENT_AMBULATORY_CARE_PROVIDER_SITE_OTHER): Admitting: Internal Medicine

## 2025-03-13 ENCOUNTER — Ambulatory Visit: Admitting: Family Medicine
